# Patient Record
Sex: Male | Born: 1985 | Hispanic: Yes | Marital: Married | Smoking: Never smoker
Health system: Southern US, Community
[De-identification: ages and names within clinical notes are randomized; demographics above are authoritative.]

## PROBLEM LIST (undated history)

## (undated) DIAGNOSIS — R12 Heartburn: Secondary | ICD-10-CM

## (undated) DIAGNOSIS — I10 Essential (primary) hypertension: Secondary | ICD-10-CM

## (undated) DIAGNOSIS — M5412 Radiculopathy, cervical region: Secondary | ICD-10-CM

## (undated) HISTORY — PX: ADENOIDECTOMY: SUR15

---

## 2016-12-03 DIAGNOSIS — T675XXA Heat exhaustion, unspecified, initial encounter: Secondary | ICD-10-CM | POA: Insufficient documentation

## 2018-10-12 ENCOUNTER — Other Ambulatory Visit: Payer: Self-pay

## 2018-10-12 ENCOUNTER — Emergency Department: Payer: Self-pay

## 2018-10-12 ENCOUNTER — Emergency Department
Admission: EM | Admit: 2018-10-12 | Discharge: 2018-10-12 | Disposition: A | Discharge status: Home / Self Care | Payer: Self-pay | Attending: Emergency Medicine | Admitting: Emergency Medicine

## 2018-10-12 ENCOUNTER — Encounter: Payer: Self-pay | Admitting: *Deleted

## 2018-10-12 DIAGNOSIS — J069 Acute upper respiratory infection, unspecified: Secondary | ICD-10-CM | POA: Insufficient documentation

## 2018-10-12 LAB — COMPREHENSIVE METABOLIC PANEL
ALK PHOS: 45 U/L (ref 38–126)
ALT: 26 U/L (ref 0–44)
AST: 24 U/L (ref 15–41)
Albumin: 4.2 g/dL (ref 3.5–5.0)
Anion gap: 10 (ref 5–15)
BUN: 15 mg/dL (ref 6–20)
CO2: 28 mmol/L (ref 22–32)
CREATININE: 0.96 mg/dL (ref 0.61–1.24)
Calcium: 9 mg/dL (ref 8.9–10.3)
Chloride: 99 mmol/L (ref 98–111)
GFR calc Af Amer: >60 mL/min (ref >60)
GFR calc non Af Amer: >60 mL/min (ref >60)
Glucose, Bld: 103 mg/dL — ABNORMAL HIGH (ref 70–99)
Potassium: 3.8 mmol/L (ref 3.5–5.1)
SODIUM: 137 mmol/L (ref 135–145)
Total Bilirubin: 0.5 mg/dL (ref 0.3–1.2)
Total Protein: 7.3 g/dL (ref 6.5–8.1)

## 2018-10-12 LAB — CBC WITH DIFFERENTIAL/PLATELET
Abs Immature Granulocytes: 0.02 10*3/uL (ref 0.00–0.07)
BASOS PCT: 1 %
Basophils Absolute: 0.1 10*3/uL (ref 0.0–0.1)
Eosinophils Absolute: 0.5 10*3/uL (ref 0.0–0.5)
Eosinophils Relative: 6 %
HCT: 43.6 % (ref 39.0–52.0)
Hemoglobin: 14.9 g/dL (ref 13.0–17.0)
Immature Granulocytes: 0 %
Lymphocytes Relative: 38 %
Lymphs Abs: 2.9 10*3/uL (ref 0.7–4.0)
MCH: 29 pg (ref 26.0–34.0)
MCHC: 34.2 g/dL (ref 30.0–36.0)
MCV: 85 fL (ref 80.0–100.0)
Monocytes Absolute: 0.5 10*3/uL (ref 0.1–1.0)
Monocytes Relative: 6 %
Neutro Abs: 3.7 10*3/uL (ref 1.7–7.7)
Neutrophils Relative %: 49 %
Platelets: 285 10*3/uL (ref 150–400)
RBC: 5.13 MIL/uL (ref 4.22–5.81)
RDW: 12.6 % (ref 11.5–15.5)
WBC: 7.6 10*3/uL (ref 4.0–10.5)
nRBC: 0 % (ref 0.0–0.2)

## 2018-10-12 LAB — INFLUENZA PANEL BY PCR (TYPE A & B)
Influenza A By PCR: NEGATIVE
Influenza B By PCR: NEGATIVE

## 2018-10-12 MED ORDER — ALBUTEROL SULFATE HFA 108 (90 BASE) MCG/ACT IN AERS: 2.0000 | INHALATION_SPRAY | Freq: Four times a day (QID) | RESPIRATORY_TRACT | 2 refills | Status: DC | PRN

## 2018-10-12 NOTE — ED Triage Notes (Signed)
Pt arrives with c/o shortness of breath for about 3 days. Reports he has been cleaning a lot with bleach over the past several days because a [place] at the base was there over the weekend and is COVID r/o. He has also felt some lightheadedness, dry cough and "felt hot" but has not taken his temperature. No meds PTA. Pt works at [employer] in [place] and returned here yesterday.

## 2018-10-12 NOTE — Discharge Instructions (Addendum)
Please check your temperature multiple times per day.  Please keep yourself and self-isolation.  If you spike a fever please call your primary care doctor to try to arrange for outpatient testing.  Return to the emergency department for any significant trouble breathing or chest pain, or any other symptom personally concerning to yourself.  Otherwise please use Tylenol 1000 mg every 6 hours at home for fever/discomfort.

## 2018-10-12 NOTE — ED Provider Notes (Signed)
[facility] Emergency Department Provider Note  Time seen: 8:45 PM  I have reviewed the triage vital signs and the nursing notes.   HISTORY  Chief Complaint Shortness of Breath    HPI Damon Stevens is a 33 y.o. male with no significant past medical history presents to the emergency department for 3 days of cough congestion and fever.  According to the patient he is in the [employer], has been stationed in [place] area, and came to this area to visit friends this morning.  Patient states subjective fever, cough, congestion, shortness of breath and chest tightness.  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 History reviewed. No pertinent past medical history.  There are no active problems to display for this patient.   History reviewed. No pertinent surgical history.  Prior to Admission medications   Not on File    No Known Allergies  No family history on file.  Social History Social History   Tobacco Use  . Smoking status: Never Smoker  Substance Use Topics  . Alcohol use: Yes  . Drug use: Never    Review of Systems Constitutional: Negative for fever. Cardiovascular: Negative for chest pain. Respiratory: Shortness of breath.  Positive for cough. Gastrointestinal: Negative for abdominal pain, vomiting Musculoskeletal: Negative for musculoskeletal complaints Skin: Negative for skin complaints  Neurological: Negative for headache All other ROS negative  ____________________________________________   PHYSICAL EXAM:  VITAL SIGNS: ED Triage Vitals [10/12/18 1928]  Enc Vitals Group     BP 135/82     Pulse Rate 82     Resp 18     Temp 98.5 F (36.9 C)     Temp Source Oral     SpO2 98 %     Weight      Height      Head Circumference      Peak Flow      Pain Score      Pain Loc      Pain Edu?      Excl. in  GC?    Constitutional: Alert and oriented. Well appearing and in no distress. Eyes: Normal exam ENT   Head: Normocephalic and atraumatic.   Mouth/Throat: Mucous membranes are moist. Cardiovascular: Normal rate, regular rhythm.  Respiratory: Normal respiratory effort without tachypnea nor retractions. Breath sounds are clear Gastrointestinal: Soft and nontender. No distention.   Musculoskeletal: Nontender with normal range of motion in all extremities. Neurologic:  Normal speech and language. No gross focal neurologic deficits  Skin:  Skin is warm, dry and intact.  Psychiatric: Mood and affect are normal.   ____________________________________________    RADIOLOGY  Chest x-ray negative  ____________________________________________   INITIAL IMPRESSION / ASSESSMENT AND PLAN / ED COURSE  Pertinent labs & imaging results that were available during my care of the patient were reviewed by me and considered in my medical decision making (see chart for details).  Patient presents to the emergency department with concerns of cough and congestion, subjective fever at home.  Patient is visiting from [place] area is in the [employer].  Reassuringly patient is afebrile in the emergency department.  Patient does have occasional dry cough.  We will check labs including influenza.  Patient's influenza test is negative, labs are largely within normal limits including a negative/normal white blood cell count.  No lymphopenia.  No LFT elevation.  Extremely low suspicion  for coronavirus I discussed this with the patient, we will defer testing at this time.  I did discuss my normal self-isolation precautions as well as supportive care at home.  ____________________________________________   FINAL CLINICAL IMPRESSION(S) / ED DIAGNOSES  Upper respiratory infection   Minna Antis, MD 10/12/18 2204

## 2019-04-20 ENCOUNTER — Emergency Department: Payer: Self-pay

## 2019-04-20 ENCOUNTER — Other Ambulatory Visit: Payer: Self-pay

## 2019-04-20 ENCOUNTER — Emergency Department
Admission: EM | Admit: 2019-04-20 | Discharge: 2019-04-20 | Disposition: A | Discharge status: Home / Self Care | Payer: Self-pay | Attending: Emergency Medicine | Admitting: Emergency Medicine

## 2019-04-20 DIAGNOSIS — R0789 Other chest pain: Secondary | ICD-10-CM | POA: Insufficient documentation

## 2019-04-20 HISTORY — DX: Heartburn: R12

## 2019-04-20 LAB — CBC
HCT: 43 % (ref 39.0–52.0)
Hemoglobin: 14.6 g/dL (ref 13.0–17.0)
MCH: 28.6 pg (ref 26.0–34.0)
MCHC: 34 g/dL (ref 30.0–36.0)
MCV: 84.3 fL (ref 80.0–100.0)
Platelets: 263 10*3/uL (ref 150–400)
RBC: 5.1 MIL/uL (ref 4.22–5.81)
RDW: 13 % (ref 11.5–15.5)
WBC: 9.8 10*3/uL (ref 4.0–10.5)
nRBC: 0 % (ref 0.0–0.2)

## 2019-04-20 LAB — BASIC METABOLIC PANEL
Anion gap: 10 (ref 5–15)
BUN: 17 mg/dL (ref 6–20)
CO2: 28 mmol/L (ref 22–32)
Calcium: 9.7 mg/dL (ref 8.9–10.3)
Chloride: 102 mmol/L (ref 98–111)
Creatinine, Ser: 0.89 mg/dL (ref 0.61–1.24)
GFR calc Af Amer: >60 mL/min (ref >60)
GFR calc non Af Amer: >60 mL/min (ref >60)
Glucose, Bld: 102 mg/dL — ABNORMAL HIGH (ref 70–99)
Potassium: 3.9 mmol/L (ref 3.5–5.1)
Sodium: 140 mmol/L (ref 135–145)

## 2019-04-20 LAB — TROPONIN I (HIGH SENSITIVITY): Troponin I (High Sensitivity): 4 ng/L (ref <18)

## 2019-04-20 MED ORDER — ASPIRIN 81 MG PO CHEW
324.0000 mg | CHEWABLE_TABLET | Freq: Once | ORAL | Status: AC
  Administered 2019-04-20: 324 mg via ORAL
  Filled 2019-04-20: qty 4

## 2019-04-20 NOTE — ED Provider Notes (Signed)
[facility] Emergency Department Provider Note  ____________________________________________   First MD Initiated Contact with Patient 04/20/19 [phone]     (approximate)  I have reviewed the triage vital signs and the nursing notes.   HISTORY  Chief Complaint Chest Pain    HPI Damon Stevens is a 33 y.o. male with medical history as listed below who presents for evaluation of chest pain for the last 3 days.  He said that it started after he spent the day working on his car which required a lot of lifting and straining.  He has sharp pain in the left side of his chest that is been constant for the last 3 days and worse when he moves around or presses on it, nothing in particular makes it better.  He started to worry that he was dying from heart attack and he said that he thinks he had a panic attack which made the chest pain worse.  He denies any shortness of breath, nausea, vomiting, fever, chills, sore throat, abdominal pain.  He has no history of heart disease and no first-degree relatives who had heart heart attacks at age 44 or less.  He has no history of diabetes, smoking, high cholesterol, obesity, nor peripheral vascular disease nor prior stroke.   He reports the pain is currently mild.       Past Medical History:  Diagnosis Date  . Heartburn     There are no active problems to display for this patient.   History reviewed. No pertinent surgical history.  Prior to Admission medications   Medication Sig Start Date End Date Taking? Authorizing Provider  albuterol (PROVENTIL HFA;VENTOLIN HFA) 108 (90 Base) MCG/ACT inhaler Inhale 2 puffs into the lungs every 6 (six) hours as needed for wheezing or shortness of breath. 10/12/18   Minna Antis, MD    Allergies Patient has no known allergies.  No family history on file.  Social History Social History   Tobacco Use  . Smoking status: Never Smoker  . Smokeless tobacco: Never Used   Substance Use Topics  . Alcohol use: Yes  . Drug use: Never    Review of Systems Constitutional: No fever/chills Eyes: No visual changes. ENT: No sore throat. Cardiovascular: +chest pain. Respiratory: Denies shortness of breath. Gastrointestinal: No abdominal pain.  No nausea, no vomiting.  No diarrhea.  No constipation. Genitourinary: Negative for dysuria. Musculoskeletal: Negative for neck pain.  Negative for back pain. Integumentary: Negative for rash. Neurological: Negative for headaches, focal weakness or numbness.   ____________________________________________   PHYSICAL EXAM:  VITAL SIGNS: ED Triage Vitals  Enc Vitals Group     BP 04/20/19 0212 130/82     Pulse Rate 04/20/19 0212 68     Resp 04/20/19 0212 18     Temp 04/20/19 0212 (!) 97.4 F (36.3 C)     Temp Source 04/20/19 0212 Oral     SpO2 04/20/19 0308 99 %     Weight 04/20/19 0212 81.6 kg (180 lb)     Height 04/20/19 0212 1.829 m (6')     Head Circumference --      Peak Flow --      Pain Score 04/20/19 0212 7     Pain Loc --      Pain Edu? --      Excl. in GC? --     Constitutional: Alert and oriented.  Well-appearing and in no distress. Eyes: Conjunctivae are normal.  Head: Atraumatic. Nose: No congestion/rhinnorhea. Mouth/Throat:  Mucous membranes are moist. Neck: No stridor.  No meningeal signs.   Cardiovascular: Normal rate, regular rhythm. Good peripheral circulation. Grossly normal heart sounds. Respiratory: Normal respiratory effort.  No retractions. Gastrointestinal: Soft and nontender. No distention.  Musculoskeletal: Reproducible anterior chest wall tenderness to both the right and left side of the chest although the tenderness to palpation of the left side is greater than the right.  No lower extremity tenderness nor edema. No gross deformities of extremities. Neurologic:  Normal speech and language. No gross focal neurologic deficits are appreciated.  Skin:  Skin is warm, dry and intact.  Psychiatric: Mood and affect are normal. Speech and behavior are normal.  ____________________________________________   LABS (all labs ordered are listed, but only abnormal results are displayed)  Labs Reviewed  BASIC METABOLIC PANEL - Abnormal; Notable for the following components:      Result Value   Glucose, Bld 102 (*)    All other components within normal limits  CBC  TROPONIN I (HIGH SENSITIVITY)   ____________________________________________  EKG  ED ECG REPORT I, Hinda Kehr, the attending physician, personally viewed and interpreted this ECG.  Date: 04/20/2019 EKG Time: 2:17 AM Rate: 63 Rhythm: normal sinus rhythm QRS Axis: normal Intervals: normal ST/T Wave abnormalities: normal Narrative Interpretation: no evidence of acute ischemia  ____________________________________________  RADIOLOGY I, Hinda Kehr, personally viewed and evaluated these images (plain radiographs) as part of my medical decision making, as well as reviewing the written report by the radiologist.  ED MD interpretation: No indication of acute abnormality on chest x-ray  Official radiology report(s): Dg Chest 2 View  Result Date: 04/20/2019 CLINICAL DATA:  Midsternal chest pain for 3 days, question stress or panic attack EXAM: CHEST - 2 VIEW COMPARISON:  Radiograph 10/12/2018 FINDINGS: No consolidation, features of edema, pneumothorax, or effusion. Pulmonary vascularity is normally distributed. The cardiomediastinal contours are unremarkable. No acute osseous or soft tissue abnormality. IMPRESSION: No acute cardiopulmonary abnormality. Electronically Signed   By: Lovena Le M.D.   On: 04/20/2019 03:12    ____________________________________________   PROCEDURES   Procedure(s) performed (including Critical Care):  Procedures   ____________________________________________   INITIAL IMPRESSION / MDM / [place] / ED COURSE  As part of my medical decision making, I  reviewed the following data within the [place] notes reviewed and incorporated, Labs reviewed , EKG interpreted , Old chart reviewed, Radiograph reviewed  and Notes from prior ED visits   Differential diagnosis includes, but is not limited to, anterior chest wall strain, costochondritis, pericarditis/myocarditis, ACS, PE, pneumonia, pneumothorax.  The patient is well-appearing and in no distress, healthy body habitus, normal vital signs, no tachycardia nor hypoxemia.  Lab work is reassuring with a normal basic metabolic panel, CBC, and a high-sensitivity troponin of 4.  He has a  HEAR Score: 0 and is PERC negative.  I do not feel it is necessary to get a second troponin at this time based on his history, physical exam, low risk, and reproducible tenderness to palpation of the anterior chest wall.  I provided reassurance and encouraged outpatient follow-up.  I am giving him a full dose aspirin before he goes but that is primarily and its role as an NSAID and analgesic rather than as an antiplatelet agent.  I gave my usual customary return precautions and he understands and agrees with the plan.       ____________________________________________  FINAL CLINICAL IMPRESSION(S) / ED DIAGNOSES  Final diagnoses:  Chest wall  pain  Atypical chest pain     MEDICATIONS GIVEN DURING THIS VISIT:  Medications  aspirin chewable tablet 324 mg (324 mg Oral Given 04/20/19 0416)     ED Discharge Orders    None      *Please note:  Damon Stevens was evaluated in Emergency Department on 04/20/2019 for the symptoms described in the history of present illness. He was evaluated in the context of the global COVID-19 pandemic, which necessitated consideration that the patient might be at risk for infection with the SARS-CoV-2 virus that causes COVID-19. Institutional protocols and algorithms that pertain to the evaluation of patients at risk for COVID-19 are in a state of rapid  change based on information released by regulatory bodies including the CDC and federal and state organizations. These policies and algorithms were followed during the patient's care in the ED.  Some ED evaluations and interventions may be delayed as a result of limited staffing during the pandemic.*  Note:  This document was prepared using Dragon voice recognition software and may include unintentional dictation errors.   Loleta RoseForbach, Faheem Ziemann, MD 04/20/19 [phone]

## 2019-04-20 NOTE — ED Triage Notes (Signed)
Patient to ED for complaint of midsternal chest pain x 3 days. Tonight he isn't sure if it was stress or a panic attack that made it worse. Patient denies nausea or vomiting, denies fever or cough.

## 2019-04-20 NOTE — ED Notes (Signed)
Pt reports chest pain sharp 6/10 left chest worse with deep breathing, pt reports holding his son earlier today and pt reports probable muscle pull and then reports panic attack with "throat felt like it was closing" - pt concerned because of family member dying of cardiac illness

## 2019-04-20 NOTE — ED Notes (Signed)
No peripheral IV placed this visit.   Discharge instructions reviewed with patient. Questions fielded by this RN. Patient verbalizes understanding of instructions. Patient discharged home in stable condition per XXX . No acute distress noted at time of discharge.

## 2019-04-20 NOTE — Discharge Instructions (Signed)

## 2019-05-14 ENCOUNTER — Other Ambulatory Visit: Payer: Self-pay

## 2019-05-14 ENCOUNTER — Telehealth: Payer: Self-pay | Admitting: Emergency Medicine

## 2019-05-14 ENCOUNTER — Emergency Department
Admission: EM | Admit: 2019-05-14 | Discharge: 2019-05-14 | Disposition: A | Discharge status: Home / Self Care | Payer: Self-pay | Attending: Emergency Medicine | Admitting: Emergency Medicine

## 2019-05-14 DIAGNOSIS — R0789 Other chest pain: Secondary | ICD-10-CM | POA: Insufficient documentation

## 2019-05-14 DIAGNOSIS — Z5321 Procedure and treatment not carried out due to patient leaving prior to being seen by health care provider: Secondary | ICD-10-CM | POA: Insufficient documentation

## 2019-05-14 LAB — COMPREHENSIVE METABOLIC PANEL
ALT: 23 U/L (ref 0–44)
AST: 25 U/L (ref 15–41)
Albumin: 4.5 g/dL (ref 3.5–5.0)
Alkaline Phosphatase: 53 U/L (ref 38–126)
Anion gap: 8 (ref 5–15)
BUN: 11 mg/dL (ref 6–20)
CO2: 29 mmol/L (ref 22–32)
Calcium: 9.1 mg/dL (ref 8.9–10.3)
Chloride: 101 mmol/L (ref 98–111)
Creatinine, Ser: 1.01 mg/dL (ref 0.61–1.24)
GFR calc Af Amer: >60 mL/min (ref >60)
GFR calc non Af Amer: >60 mL/min (ref >60)
Glucose, Bld: 103 mg/dL — ABNORMAL HIGH (ref 70–99)
Potassium: 3.6 mmol/L (ref 3.5–5.1)
Sodium: 138 mmol/L (ref 135–145)
Total Bilirubin: 0.7 mg/dL (ref 0.3–1.2)
Total Protein: 8 g/dL (ref 6.5–8.1)

## 2019-05-14 LAB — CBC
HCT: 42.6 % (ref 39.0–52.0)
Hemoglobin: 14.5 g/dL (ref 13.0–17.0)
MCH: 28.9 pg (ref 26.0–34.0)
MCHC: 34 g/dL (ref 30.0–36.0)
MCV: 85 fL (ref 80.0–100.0)
Platelets: 295 10*3/uL (ref 150–400)
RBC: 5.01 MIL/uL (ref 4.22–5.81)
RDW: 13 % (ref 11.5–15.5)
WBC: 7.7 10*3/uL (ref 4.0–10.5)
nRBC: 0 % (ref 0.0–0.2)

## 2019-05-14 LAB — TROPONIN I (HIGH SENSITIVITY): Troponin I (High Sensitivity): <2 ng/L (ref <18)

## 2019-05-14 MED ORDER — IBUPROFEN 600 MG PO TABS
600.0000 mg | ORAL_TABLET | Freq: Once | ORAL | Status: AC
  Administered 2019-05-14: 600 mg via ORAL
  Filled 2019-05-14: qty 1

## 2019-05-14 NOTE — ED Triage Notes (Signed)
Pt here with co chest pain for 1-2 days was here a month ago for the same and was told it was "muscle was swollen".

## 2019-05-14 NOTE — Telephone Encounter (Signed)
Called patient due to lwot to inquire about condition and follow up plans. He says he strained a muscle.  Has had a couple previous visits.  He does not have a pcp, and I explained that he should get a pcp to find out why this happens.

## 2019-12-12 DIAGNOSIS — R079 Chest pain, unspecified: Secondary | ICD-10-CM | POA: Insufficient documentation

## 2019-12-12 DIAGNOSIS — R4589 Other symptoms and signs involving emotional state: Secondary | ICD-10-CM | POA: Insufficient documentation

## 2019-12-12 DIAGNOSIS — F419 Anxiety disorder, unspecified: Secondary | ICD-10-CM | POA: Insufficient documentation

## 2019-12-12 DIAGNOSIS — I10 Essential (primary) hypertension: Secondary | ICD-10-CM | POA: Insufficient documentation

## 2019-12-25 ENCOUNTER — Encounter: Payer: Self-pay | Admitting: *Deleted

## 2019-12-25 ENCOUNTER — Other Ambulatory Visit: Payer: Self-pay

## 2019-12-25 ENCOUNTER — Emergency Department: Payer: Self-pay

## 2019-12-25 ENCOUNTER — Emergency Department
Admission: EM | Admit: 2019-12-25 | Discharge: 2019-12-25 | Disposition: A | Discharge status: Home / Self Care | Payer: Self-pay | Attending: Emergency Medicine | Admitting: Emergency Medicine

## 2019-12-25 DIAGNOSIS — R197 Diarrhea, unspecified: Secondary | ICD-10-CM | POA: Insufficient documentation

## 2019-12-25 DIAGNOSIS — R112 Nausea with vomiting, unspecified: Secondary | ICD-10-CM | POA: Insufficient documentation

## 2019-12-25 DIAGNOSIS — Z5321 Procedure and treatment not carried out due to patient leaving prior to being seen by health care provider: Secondary | ICD-10-CM | POA: Insufficient documentation

## 2019-12-25 DIAGNOSIS — R0789 Other chest pain: Secondary | ICD-10-CM | POA: Insufficient documentation

## 2019-12-25 LAB — CBC
HCT: 39.6 % (ref 39.0–52.0)
Hemoglobin: 13.9 g/dL (ref 13.0–17.0)
MCH: 29.2 pg (ref 26.0–34.0)
MCHC: 35.1 g/dL (ref 30.0–36.0)
MCV: 83.2 fL (ref 80.0–100.0)
Platelets: 280 10*3/uL (ref 150–400)
RBC: 4.76 MIL/uL (ref 4.22–5.81)
RDW: 12 % (ref 11.5–15.5)
WBC: 7 10*3/uL (ref 4.0–10.5)
nRBC: 0 % (ref 0.0–0.2)

## 2019-12-25 LAB — BASIC METABOLIC PANEL
Anion gap: 11 (ref 5–15)
BUN: 14 mg/dL (ref 6–20)
CO2: 27 mmol/L (ref 22–32)
Calcium: 9 mg/dL (ref 8.9–10.3)
Chloride: 102 mmol/L (ref 98–111)
Creatinine, Ser: 1.05 mg/dL (ref 0.61–1.24)
GFR calc Af Amer: >60 mL/min (ref >60)
GFR calc non Af Amer: >60 mL/min (ref >60)
Glucose, Bld: 105 mg/dL — ABNORMAL HIGH (ref 70–99)
Potassium: 3.6 mmol/L (ref 3.5–5.1)
Sodium: 140 mmol/L (ref 135–145)

## 2019-12-25 LAB — TROPONIN I (HIGH SENSITIVITY)
Troponin I (High Sensitivity): 2 ng/L (ref <18)
Troponin I (High Sensitivity): <2 ng/L (ref <18)

## 2019-12-25 MED ORDER — SODIUM CHLORIDE 0.9% FLUSH: 3.0000 mL | Freq: Once | INTRAVENOUS | Status: DC

## 2019-12-25 NOTE — ED Triage Notes (Signed)
Pt has had blood pressure meds changed 4 times during the past month. No bp meds for 2 days.  Pt reports n/v/d today.  Pt reports intermittent chest pain today.  Pt alert.  Speech clear.

## 2019-12-29 ENCOUNTER — Other Ambulatory Visit: Payer: Self-pay

## 2019-12-29 ENCOUNTER — Emergency Department: Payer: No Typology Code available for payment source

## 2019-12-29 ENCOUNTER — Emergency Department
Admission: EM | Admit: 2019-12-29 | Discharge: 2019-12-29 | Disposition: A | Discharge status: Home / Self Care | Payer: No Typology Code available for payment source | Attending: Emergency Medicine | Admitting: Emergency Medicine

## 2019-12-29 ENCOUNTER — Encounter: Payer: Self-pay | Admitting: Emergency Medicine

## 2019-12-29 DIAGNOSIS — R0602 Shortness of breath: Secondary | ICD-10-CM | POA: Insufficient documentation

## 2019-12-29 DIAGNOSIS — R0789 Other chest pain: Secondary | ICD-10-CM

## 2019-12-29 DIAGNOSIS — I10 Essential (primary) hypertension: Secondary | ICD-10-CM | POA: Insufficient documentation

## 2019-12-29 HISTORY — DX: Essential (primary) hypertension: I10

## 2019-12-29 LAB — CBC WITH DIFFERENTIAL/PLATELET
Abs Immature Granulocytes: 0.03 10*3/uL (ref 0.00–0.07)
Basophils Absolute: 0 10*3/uL (ref 0.0–0.1)
Basophils Relative: 0 %
Eosinophils Absolute: 0.3 10*3/uL (ref 0.0–0.5)
Eosinophils Relative: 3 %
HCT: 39.7 % (ref 39.0–52.0)
Hemoglobin: 13.8 g/dL (ref 13.0–17.0)
Immature Granulocytes: 0 %
Lymphocytes Relative: 33 %
Lymphs Abs: 3.4 10*3/uL (ref 0.7–4.0)
MCH: 28.9 pg (ref 26.0–34.0)
MCHC: 34.8 g/dL (ref 30.0–36.0)
MCV: 83.1 fL (ref 80.0–100.0)
Monocytes Absolute: 0.5 10*3/uL (ref 0.1–1.0)
Monocytes Relative: 5 %
Neutro Abs: 6 10*3/uL (ref 1.7–7.7)
Neutrophils Relative %: 59 %
Platelets: 290 10*3/uL (ref 150–400)
RBC: 4.78 MIL/uL (ref 4.22–5.81)
RDW: 12.4 % (ref 11.5–15.5)
WBC: 10.2 10*3/uL (ref 4.0–10.5)
nRBC: 0 % (ref 0.0–0.2)

## 2019-12-29 LAB — BASIC METABOLIC PANEL
Anion gap: 9 (ref 5–15)
BUN: 18 mg/dL (ref 6–20)
CO2: 28 mmol/L (ref 22–32)
Calcium: 9.3 mg/dL (ref 8.9–10.3)
Chloride: 101 mmol/L (ref 98–111)
Creatinine, Ser: 1.02 mg/dL (ref 0.61–1.24)
GFR calc Af Amer: >60 mL/min (ref >60)
GFR calc non Af Amer: >60 mL/min (ref >60)
Glucose, Bld: 94 mg/dL (ref 70–99)
Potassium: 3.8 mmol/L (ref 3.5–5.1)
Sodium: 138 mmol/L (ref 135–145)

## 2019-12-29 LAB — TROPONIN I (HIGH SENSITIVITY): Troponin I (High Sensitivity): 3 ng/L (ref <18)

## 2019-12-29 NOTE — ED Triage Notes (Signed)
Pt arrives ambulatory to triage with c/o SOB and possible Hypertension. Pt is able to talk in complete sentences at this time with NAD.

## 2019-12-29 NOTE — ED Provider Notes (Signed)
[facility] Emergency Department Provider Note ____________________________________________   First MD Initiated Contact with Patient 12/29/19 [phone]     (approximate)  I have reviewed the triage vital signs and the nursing notes.   HISTORY  Chief Complaint Shortness of Breath    HPI Damon Stevens is a 34 y.o. male with PMH as noted below who presents with an episode of shortness of breath, acute onset around 5 AM, which awoke him from sleep.  The patient states it is now resolved.  He reports that he felt congested in his nose.  The patient has also had chest pain over the last few days which he describes as sharp and exacerbated by certain positions and by touching his chest.  He thinks it is a rib pain.  He reports one episode of vomiting this morning.  The patient states that he was diagnosed with hypertension a few months ago and was put on medication but it was changed several times due to side effects.  He was most recently on amlodipine, but it was stopped 5 days ago because he stated that he felt lightheaded and his doctor decided to try to see if his blood pressure could be controlled without medication.  Past Medical History:  Diagnosis Date  . Heartburn   . Hypertension     There are no problems to display for this patient.   History reviewed. No pertinent surgical history.  Prior to Admission medications   Medication Sig Start Date End Date Taking? Authorizing Provider  albuterol (PROVENTIL HFA;VENTOLIN HFA) 108 (90 Base) MCG/ACT inhaler Inhale 2 puffs into the lungs every 6 (six) hours as needed for wheezing or shortness of breath. 10/12/18   Minna Antis, MD    Allergies Patient has no known allergies.  No family history on file.  Social History Social History   Tobacco Use  . Smoking status: Never Smoker  . Smokeless tobacco: Never Used  Substance Use Topics  . Alcohol use: Yes  . Drug use: Never    Review of  Systems  Constitutional: No fever. Eyes: No redness. ENT: No sore throat.  Positive for resolved nasal congestion. Cardiovascular: Positive for chest pain. Respiratory: Positive for resolved shortness of breath. Gastrointestinal: Positive for resolved vomiting.  No diarrhea.  Genitourinary: Negative for flank pain. Musculoskeletal: Negative for back pain. Skin: Negative for rash. Neurological: Negative for headache.   ____________________________________________   PHYSICAL EXAM:  VITAL SIGNS: ED Triage Vitals  Enc Vitals Group     BP 12/29/19 0517 140/85     Pulse Rate 12/29/19 0517 95     Resp 12/29/19 0517 15     Temp 12/29/19 0517 98 F (36.7 C)     Temp Source 12/29/19 0517 Oral     SpO2 12/29/19 0517 100 %     Weight 12/29/19 0517 183 lb (83 kg)     Height 12/29/19 0517 6' (1.829 m)     Head Circumference --      Peak Flow --      Pain Score 12/29/19 0514 5     Pain Loc --      Pain Edu? --      Excl. in GC? --     Constitutional: Alert and oriented. Well appearing and in no acute distress. Eyes: Conjunctivae are normal.  Head: Atraumatic. Nose: No congestion/rhinnorhea.   Mouth/Throat: Mucous membranes are moist.   Neck: Normal range of motion.  Cardiovascular: Normal rate, regular rhythm. Grossly normal heart sounds.  Good peripheral circulation.  Reproducible anterior chest wall pain. Respiratory: Normal respiratory effort.  No retractions. Lungs CTAB. Gastrointestinal: No distention.  Musculoskeletal:  Extremities warm and well perfused.  Neurologic:  Normal speech and language. No gross focal neurologic deficits are appreciated.  Skin:  Skin is warm and dry. No rash noted. Psychiatric: Slightly anxious appearing.  Mood and affect are normal. Speech and behavior are normal.  ____________________________________________   LABS (all labs ordered are listed, but only abnormal results are displayed)  Labs Reviewed  CBC WITH DIFFERENTIAL/PLATELET  BASIC  METABOLIC PANEL  TROPONIN I (HIGH SENSITIVITY)   ____________________________________________  EKG  ED ECG REPORT I, Arta Silence, the attending physician, personally viewed and interpreted this ECG.  Date: 12/29/2019 EKG Time: 0513 Rate: 89 Rhythm: normal sinus rhythm QRS Axis: normal Intervals: normal ST/T Wave abnormalities: normal Narrative Interpretation: no evidence of acute ischemia  ____________________________________________  RADIOLOGY  CXR: No focal infiltrate or other acute abnormality  ____________________________________________   PROCEDURES  Procedure(s) performed: No  Procedures  Critical Care performed: No ____________________________________________   INITIAL IMPRESSION / ASSESSMENT AND PLAN / ED COURSE  Pertinent labs & imaging results that were available during my care of the patient were reviewed by me and considered in my medical decision making (see chart for details).  34 year old male with PMH as noted above including hypertension presents with an episode of shortness of breath that awoke him from sleep, and which he felt was associated with nasal congestion.  This has now resolved.  The patient has had some atypical and reproducible chest pain over the last few days.  He states that he had been on amlodipine but stopped several days ago per his doctor at the [place].  On exam today, he is somewhat anxious but well-appearing.  His vital signs are normal.  The physical exam is unremarkable except for mild reproducible chest wall tenderness which corresponds to the pain he is experiencing.  His lungs are clear.  Oropharynx is clear.  Work-up was obtained from triage including basic labs, troponin, EKG, and chest x-ray.  All are within normal limits.  Given the duration of the chest pain and the atypical nature, there is no indication for a repeat troponin.  The patient is extremely low risk for ACS.  There is no evidence of PE or other acute  cause of the shortness of breath and chest pain, especially given the normal vital signs and the fact that the shortness of breath has resolved.  At this time, the patient is stable for discharge home.  I counseled him on the results of the work-up and reassured him.  There is no evidence of acute respiratory issue.  He states he is following up with his primary care doctor next week, and I advised him to monitor his blood pressure at home and check in with the PMD.  I gave him thorough return precautions and he expressed understanding.  ____________________________________________   FINAL CLINICAL IMPRESSION(S) / ED DIAGNOSES  Final diagnoses:  SOB (shortness of breath)  Atypical chest pain      NEW MEDICATIONS STARTED DURING THIS VISIT:  New Prescriptions   No medications on file     Note:  This document was prepared using Dragon voice recognition software and may include unintentional dictation errors.   Arta Silence, MD 12/29/19 [phone]

## 2019-12-29 NOTE — ED Notes (Signed)
Patient reports he hasn't taken his blood pressure medication in 5 days because he was told not to take it if BP was below 140/90.  Patient reports during the night head felt full and he couldn't breath through his nose well and was concerned about his health.  Patient is able to speak complete sentence without difficulty or distress.

## 2019-12-29 NOTE — Discharge Instructions (Addendum)
Return to the ER for new, worsening, or persistent severe shortness of breath, chest pain, weakness or lightheadedness, or any other new or worsening symptoms that concern you.  Follow-up with your primary care doctor for further guidance on your blood pressure medication.

## 2019-12-30 ENCOUNTER — Emergency Department
Admission: EM | Admit: 2019-12-30 | Discharge: 2019-12-30 | Disposition: A | Discharge status: Home / Self Care | Payer: No Typology Code available for payment source | Attending: Emergency Medicine | Admitting: Emergency Medicine

## 2019-12-30 ENCOUNTER — Other Ambulatory Visit: Payer: Self-pay

## 2019-12-30 DIAGNOSIS — R070 Pain in throat: Secondary | ICD-10-CM | POA: Diagnosis not present

## 2019-12-30 DIAGNOSIS — R0989 Other specified symptoms and signs involving the circulatory and respiratory systems: Secondary | ICD-10-CM | POA: Diagnosis not present

## 2019-12-30 DIAGNOSIS — I10 Essential (primary) hypertension: Secondary | ICD-10-CM | POA: Diagnosis not present

## 2019-12-30 DIAGNOSIS — F418 Other specified anxiety disorders: Secondary | ICD-10-CM

## 2019-12-30 DIAGNOSIS — R0981 Nasal congestion: Secondary | ICD-10-CM

## 2019-12-30 DIAGNOSIS — F419 Anxiety disorder, unspecified: Secondary | ICD-10-CM | POA: Diagnosis not present

## 2019-12-30 LAB — TROPONIN I (HIGH SENSITIVITY): Troponin I (High Sensitivity): <2 ng/L (ref <18)

## 2019-12-30 LAB — BASIC METABOLIC PANEL
Anion gap: 9 (ref 5–15)
BUN: 23 mg/dL — ABNORMAL HIGH (ref 6–20)
CO2: 28 mmol/L (ref 22–32)
Calcium: 9.2 mg/dL (ref 8.9–10.3)
Chloride: 101 mmol/L (ref 98–111)
Creatinine, Ser: 1.04 mg/dL (ref 0.61–1.24)
GFR calc Af Amer: >60 mL/min (ref >60)
GFR calc non Af Amer: >60 mL/min (ref >60)
Glucose, Bld: 105 mg/dL — ABNORMAL HIGH (ref 70–99)
Potassium: 3.6 mmol/L (ref 3.5–5.1)
Sodium: 138 mmol/L (ref 135–145)

## 2019-12-30 LAB — CBC
HCT: 40.6 % (ref 39.0–52.0)
Hemoglobin: 13.9 g/dL (ref 13.0–17.0)
MCH: 29 pg (ref 26.0–34.0)
MCHC: 34.2 g/dL (ref 30.0–36.0)
MCV: 84.6 fL (ref 80.0–100.0)
Platelets: 301 10*3/uL (ref 150–400)
RBC: 4.8 MIL/uL (ref 4.22–5.81)
RDW: 12.3 % (ref 11.5–15.5)
WBC: 8.5 10*3/uL (ref 4.0–10.5)
nRBC: 0 % (ref 0.0–0.2)

## 2019-12-30 LAB — GROUP A STREP BY PCR: Group A Strep by PCR: NOT DETECTED

## 2019-12-30 NOTE — ED Provider Notes (Signed)
[facility] Emergency Department Provider Note   ____________________________________________   I have reviewed the triage vital signs and the nursing notes.   HISTORY  Chief Complaint Nasal congestion  History limited by: Not Limited   HPI Damon Stevens is a 34 y.o. male who presents to the emergency department today with primary concern for nasal congestion. He states that his nose has been congested for the past couple of days. He finds that it makes breathing at night hard. He also has some complaint of throat swelling and burning after eating a shrimp and vomiting it up. The patient says it has happened once before. The patient says he took a sinus medication earlier.  Patient states he currently does not have a primary care although he is working on getting 1 through the [place].  Records reviewed. Per medical record review patient has a history of HTN, frequent ER visits, 12 in the past month per our EMR.   Past Medical History:  Diagnosis Date  . Heartburn   . Hypertension     There are no problems to display for this patient.   History reviewed. No pertinent surgical history.  Prior to Admission medications   Medication Sig Start Date End Date Taking? Authorizing Provider  albuterol (PROVENTIL HFA;VENTOLIN HFA) 108 (90 Base) MCG/ACT inhaler Inhale 2 puffs into the lungs every 6 (six) hours as needed for wheezing or shortness of breath. 10/12/18   Harvest Dark, MD    Allergies Shrimp [shellfish allergy]  History reviewed. No pertinent family history.  Social History Social History   Tobacco Use  . Smoking status: Never Smoker  . Smokeless tobacco: Never Used  Substance Use Topics  . Alcohol use: Yes  . Drug use: Never    Review of Systems Constitutional: No fever/chills Eyes: No visual changes. ENT: Positive for throat swelling and burning. Positive for nasal congestion. Cardiovascular: Denies chest pain. Respiratory:  Denies shortness of breath. Gastrointestinal: No abdominal pain.  No nausea, no vomiting.  No diarrhea.   Genitourinary: Negative for dysuria. Musculoskeletal: Left shoulder and neck pain. Skin: Negative for rash. Neurological: Negative for headaches, focal weakness or numbness.  ____________________________________________   PHYSICAL EXAM:  VITAL SIGNS: ED Triage Vitals  Enc Vitals Group     BP 12/30/19 0231 (!) 121/105     Pulse Rate 12/30/19 0230 73     Resp 12/30/19 0230 18     Temp 12/30/19 0230 98 F (36.7 C)     Temp Source 12/30/19 0230 Oral     SpO2 12/30/19 0230 100 %     Weight 12/30/19 0232 183 lb (83 kg)     Height 12/30/19 0232 6' (1.829 m)     Head Circumference --      Peak Flow --      Pain Score 12/30/19 0230 7   Constitutional: Alert and oriented.  Eyes: Conjunctivae are normal.  ENT      Head: Normocephalic and atraumatic.      Nose: No congestion/rhinnorhea.      Mouth/Throat: Mucous membranes are moist. No intraoral swelling. No erythema.       Neck: No stridor. Hematological/Lymphatic/Immunilogical: No cervical lymphadenopathy. Cardiovascular: Normal rate, regular rhythm.  No murmurs, rubs, or gallops.  Respiratory: Normal respiratory effort without tachypnea nor retractions. Breath sounds are clear and equal bilaterally. No wheezes/rales/rhonchi. Gastrointestinal: Soft and non tender. No rebound. No guarding.  Genitourinary: Deferred Musculoskeletal: Normal range of motion in all extremities. No lower extremity edema. Neurologic:  Normal speech and language. No gross focal neurologic deficits are appreciated.  Skin:  Skin is warm, dry and intact. No rash noted. Psychiatric: Mood and affect are normal. Speech and behavior are normal. Patient exhibits appropriate insight and judgment.  ____________________________________________    LABS (pertinent positives/negatives)  Trop hs <2 CBC wbc 8.5, hgb 13.9, plt 301 BMP wnl except glu 105, bun  23  ____________________________________________   EKG  I, Nance Pear, attending physician, personally viewed and interpreted this EKG  EKG Time: 0227 Rate: 81 Rhythm: sinus rhythm Axis: normal Intervals: qtc 441 QRS: narrow ST changes: no st elevation Impression: normal ekg  ____________________________________________    RADIOLOGY  None  ____________________________________________   PROCEDURES  Procedures  ____________________________________________   INITIAL IMPRESSION / ASSESSMENT AND PLAN / ED COURSE  Pertinent labs & imaging results that were available during my care of the patient were reviewed by me and considered in my medical decision making (see chart for details).   Patient presented to the emergency department today because of concerns for nasal congestion.  Patient also had concerns for throat swelling and burning.  Patient additionally had other complaints that he said he has been dealing with including high blood pressure, cervical radiculopathy.  Per chart review the patient has multiple emergency department visits recently and does appear to have frequent emergency department visits for quite some time.  Physical exam without concerning findings today.  Blood work without concerning findings.  I did have a conversation about the patient about frequent emergency department visits.  I do have some concerns that this could eventually lead to harm either through hospital-acquired infections or radiation exposure.  I strongly encourage the patient to establish care with a primary care provider so he could have a constant source that could help him deal with his myriad of complaints.  ____________________________________________   FINAL CLINICAL IMPRESSION(S) / ED DIAGNOSES  Final diagnoses:  Nasal congestion  Anxiety about health     Note: This dictation was prepared with Dragon dictation. Any transcriptional errors that result from this process  are unintentional     Nance Pear, MD 12/30/19 [phone]

## 2019-12-30 NOTE — ED Triage Notes (Addendum)
Pt arrives to traige with c/o SOB, and dizziness, pt states "I can't get enough breath". Pt states he had eaten shrimp "about 2 hours ago". Pt c/o tongue swelling, NAD noted, Pt able to talk in complete sentences. Pt states previous similar reaction after eating shrimp (date of previous encounter unknown).

## 2019-12-30 NOTE — Discharge Instructions (Signed)
Please seek medical attention for any high fevers, chest pain, shortness of breath, change in behavior, persistent vomiting, bloody stool or any other new or concerning symptoms.  

## 2020-01-07 ENCOUNTER — Other Ambulatory Visit: Payer: Self-pay

## 2020-01-07 ENCOUNTER — Encounter: Payer: Self-pay | Admitting: Emergency Medicine

## 2020-01-07 ENCOUNTER — Emergency Department: Payer: No Typology Code available for payment source

## 2020-01-07 DIAGNOSIS — R079 Chest pain, unspecified: Secondary | ICD-10-CM | POA: Diagnosis not present

## 2020-01-07 DIAGNOSIS — Z5321 Procedure and treatment not carried out due to patient leaving prior to being seen by health care provider: Secondary | ICD-10-CM | POA: Diagnosis not present

## 2020-01-07 LAB — CBC
HCT: 40.6 % (ref 39.0–52.0)
Hemoglobin: 14.5 g/dL (ref 13.0–17.0)
MCH: 29.2 pg (ref 26.0–34.0)
MCHC: 35.7 g/dL (ref 30.0–36.0)
MCV: 81.7 fL (ref 80.0–100.0)
Platelets: 299 10*3/uL (ref 150–400)
RBC: 4.97 MIL/uL (ref 4.22–5.81)
RDW: 12.5 % (ref 11.5–15.5)
WBC: 7.6 10*3/uL (ref 4.0–10.5)
nRBC: 0 % (ref 0.0–0.2)

## 2020-01-07 LAB — BASIC METABOLIC PANEL
Anion gap: 7 (ref 5–15)
BUN: 14 mg/dL (ref 6–20)
CO2: 29 mmol/L (ref 22–32)
Calcium: 9.5 mg/dL (ref 8.9–10.3)
Chloride: 102 mmol/L (ref 98–111)
Creatinine, Ser: 1.07 mg/dL (ref 0.61–1.24)
GFR calc Af Amer: >60 mL/min (ref >60)
GFR calc non Af Amer: >60 mL/min (ref >60)
Glucose, Bld: 107 mg/dL — ABNORMAL HIGH (ref 70–99)
Potassium: 4.1 mmol/L (ref 3.5–5.1)
Sodium: 138 mmol/L (ref 135–145)

## 2020-01-07 LAB — TROPONIN I (HIGH SENSITIVITY): Troponin I (High Sensitivity): <2 ng/L (ref <18)

## 2020-01-07 MED ORDER — SODIUM CHLORIDE 0.9% FLUSH: 3.0000 mL | Freq: Once | INTRAVENOUS | Status: DC

## 2020-01-07 NOTE — ED Triage Notes (Signed)
Pt to ED from home c/o mid chest pain that started approx 1-2hr PTA, states was stressing out when it started, denies SOB or n/v/d.  Pain is non radiating.  States stopped taking BP medication on his own 1 week ago.  Pt ambulatory steady gait, chest rise even and unlabored, in NAD at this time.

## 2020-01-08 ENCOUNTER — Emergency Department
Admission: EM | Admit: 2020-01-08 | Discharge: 2020-01-08 | Disposition: A | Discharge status: Home / Self Care | Payer: No Typology Code available for payment source | Attending: Emergency Medicine | Admitting: Emergency Medicine

## 2020-01-08 NOTE — ED Notes (Signed)
No answer when called several times from lobby 

## 2020-03-17 ENCOUNTER — Emergency Department
Admission: EM | Admit: 2020-03-17 | Discharge: 2020-03-17 | Disposition: A | Discharge status: Home / Self Care | Payer: No Typology Code available for payment source | Attending: Emergency Medicine | Admitting: Emergency Medicine

## 2020-03-17 DIAGNOSIS — R079 Chest pain, unspecified: Secondary | ICD-10-CM | POA: Diagnosis present

## 2020-03-17 DIAGNOSIS — Z5321 Procedure and treatment not carried out due to patient leaving prior to being seen by health care provider: Secondary | ICD-10-CM | POA: Diagnosis not present

## 2020-03-17 NOTE — ED Triage Notes (Signed)
Patient ambulatory to triage with steady gait, without difficulty or distress noted; pt reports mid CP radiating to left side x 6 months

## 2020-04-05 DIAGNOSIS — M542 Cervicalgia: Secondary | ICD-10-CM | POA: Insufficient documentation

## 2020-06-10 ENCOUNTER — Other Ambulatory Visit (HOSPITAL_COMMUNITY): Payer: Self-pay | Admitting: Nurse Practitioner

## 2020-06-10 ENCOUNTER — Other Ambulatory Visit: Payer: Self-pay | Admitting: Nurse Practitioner

## 2020-06-10 DIAGNOSIS — Z0189 Encounter for other specified special examinations: Secondary | ICD-10-CM

## 2020-06-10 DIAGNOSIS — M542 Cervicalgia: Secondary | ICD-10-CM

## 2020-06-19 ENCOUNTER — Emergency Department: Payer: No Typology Code available for payment source

## 2020-06-19 ENCOUNTER — Emergency Department
Admission: EM | Admit: 2020-06-19 | Discharge: 2020-06-20 | Disposition: A | Discharge status: Home / Self Care | Payer: No Typology Code available for payment source | Attending: Emergency Medicine | Admitting: Emergency Medicine

## 2020-06-19 ENCOUNTER — Other Ambulatory Visit: Payer: Self-pay

## 2020-06-19 DIAGNOSIS — M542 Cervicalgia: Secondary | ICD-10-CM | POA: Insufficient documentation

## 2020-06-19 DIAGNOSIS — Z5321 Procedure and treatment not carried out due to patient leaving prior to being seen by health care provider: Secondary | ICD-10-CM | POA: Diagnosis not present

## 2020-06-19 DIAGNOSIS — M25512 Pain in left shoulder: Secondary | ICD-10-CM | POA: Diagnosis present

## 2020-06-19 DIAGNOSIS — R079 Chest pain, unspecified: Secondary | ICD-10-CM | POA: Insufficient documentation

## 2020-06-19 HISTORY — DX: Radiculopathy, cervical region: M54.12

## 2020-06-19 LAB — BASIC METABOLIC PANEL
Anion gap: 10 (ref 5–15)
BUN: 23 mg/dL — ABNORMAL HIGH (ref 6–20)
CO2: 23 mmol/L (ref 22–32)
Calcium: 8.7 mg/dL — ABNORMAL LOW (ref 8.9–10.3)
Chloride: 102 mmol/L (ref 98–111)
Creatinine, Ser: 1.1 mg/dL (ref 0.61–1.24)
GFR, Estimated: >60 mL/min (ref >60)
Glucose, Bld: 103 mg/dL — ABNORMAL HIGH (ref 70–99)
Potassium: 3.1 mmol/L — ABNORMAL LOW (ref 3.5–5.1)
Sodium: 135 mmol/L (ref 135–145)

## 2020-06-19 LAB — CBC
HCT: 39.9 % (ref 39.0–52.0)
Hemoglobin: 14 g/dL (ref 13.0–17.0)
MCH: 29.2 pg (ref 26.0–34.0)
MCHC: 35.1 g/dL (ref 30.0–36.0)
MCV: 83.1 fL (ref 80.0–100.0)
Platelets: 272 10*3/uL (ref 150–400)
RBC: 4.8 MIL/uL (ref 4.22–5.81)
RDW: 12.1 % (ref 11.5–15.5)
WBC: 9.2 10*3/uL (ref 4.0–10.5)
nRBC: 0 % (ref 0.0–0.2)

## 2020-06-19 LAB — TROPONIN I (HIGH SENSITIVITY): Troponin I (High Sensitivity): <2 ng/L (ref <18)

## 2020-06-19 NOTE — ED Triage Notes (Signed)
PT to ED via POV with c/o L shoulder, neck and CP. PT was dx through MRI with cervical radiculopathy and intercostal muscle pain about 2 days ago. PT has been having these problems for months, is in physical therapy and takes meds but pain is unbearable this evening.

## 2020-06-29 ENCOUNTER — Ambulatory Visit: Admission: RE | Admit: 2020-06-29 | Payer: No Typology Code available for payment source | Source: Ambulatory Visit

## 2020-08-28 IMAGING — CR DG CHEST 2V
1 series · 2 of 2 positions shown · non-contrast
Comparison: 07/10/2019

CLINICAL DATA: Chest pain

EXAM:
CHEST - 2 VIEW

[Series 1: dg chest 2 view · 0.14mm/px · 2 of 2 slices shown]
[im 1/2]
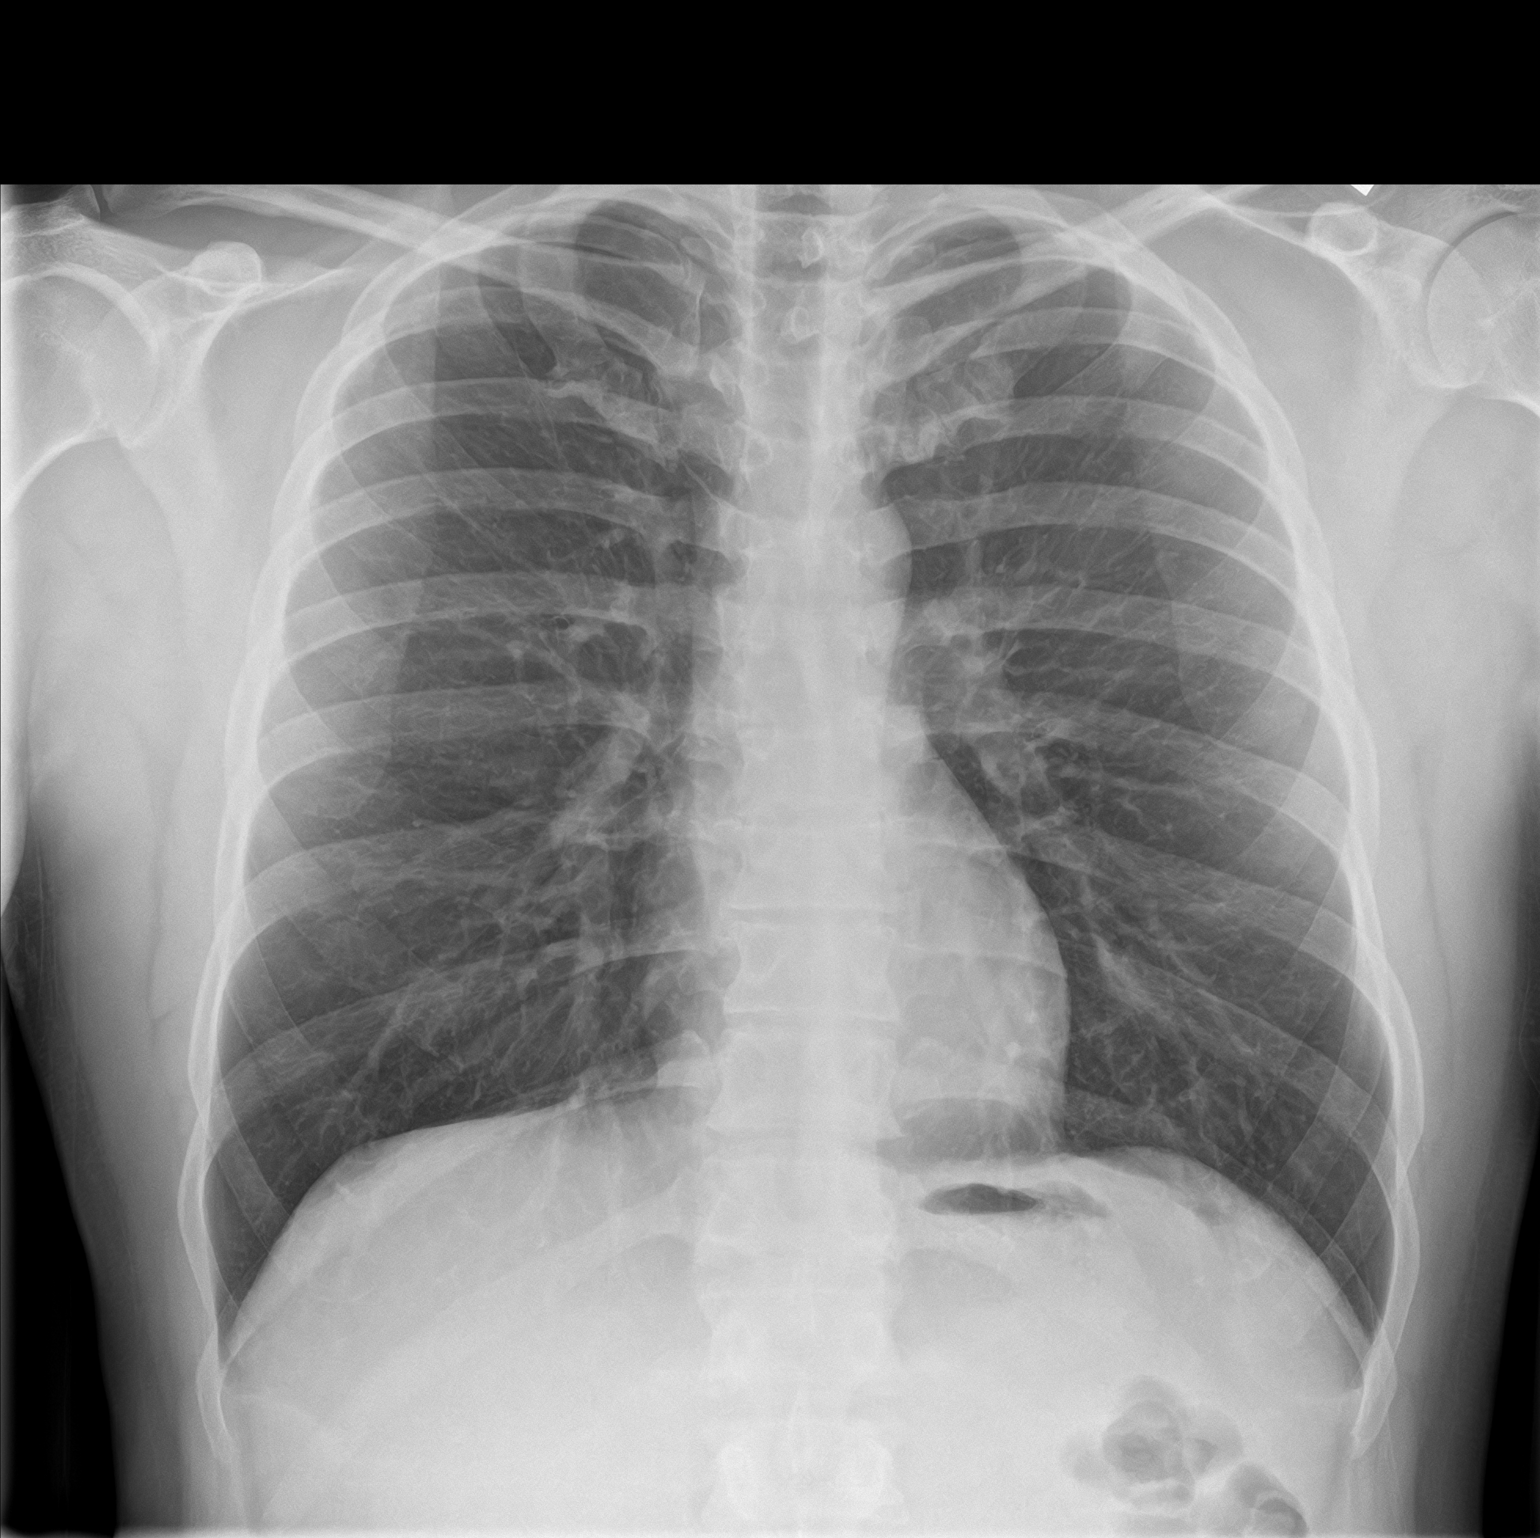
[im 2/2]
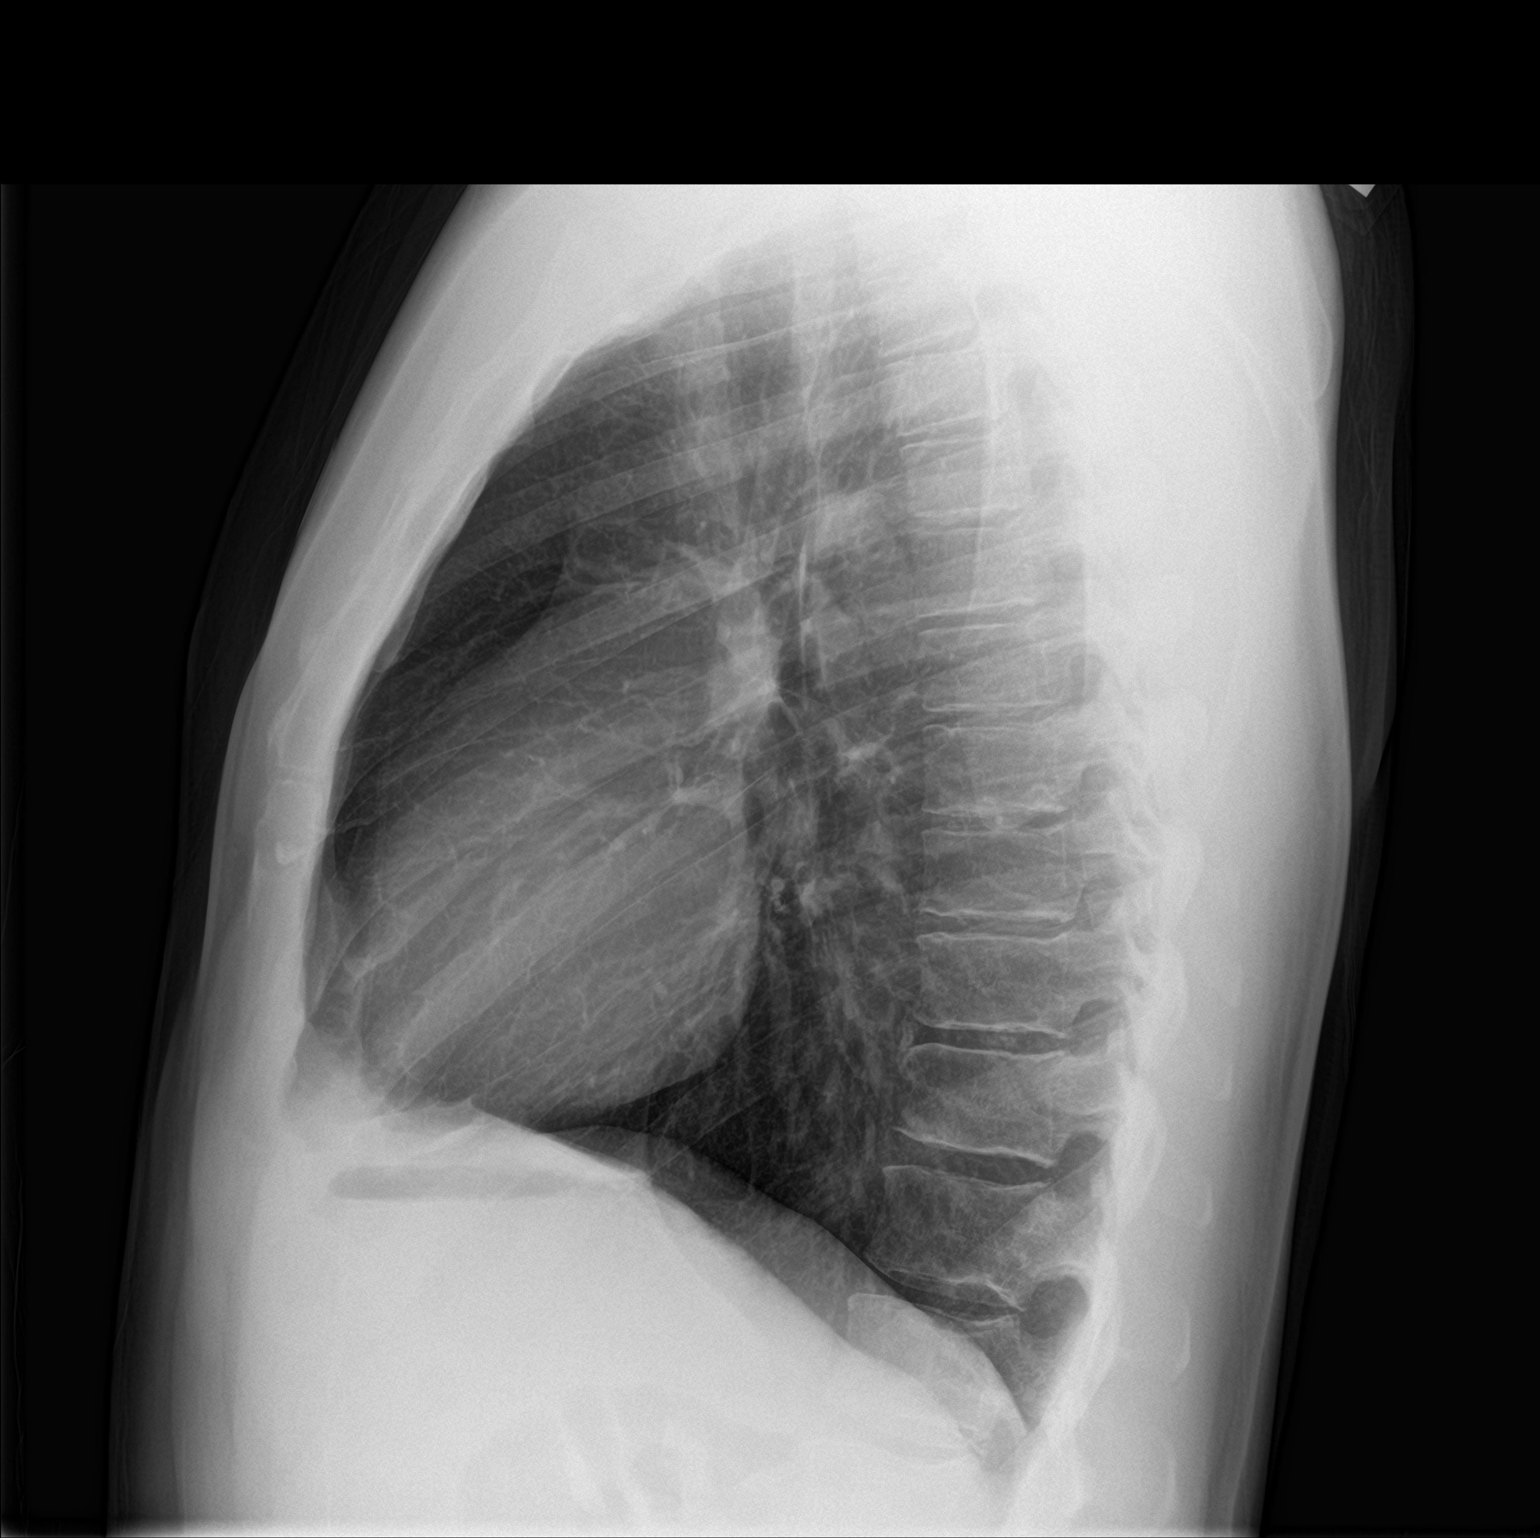

[2 of 2 positions shown; findings below may reference images not displayed]

FINDINGS: The heart size and mediastinal contours are within normal limits.
Both lungs are clear. The visualized skeletal structures are
unremarkable.
IMPRESSION: No active cardiopulmonary disease.

## 2020-09-01 IMAGING — CR DG CHEST 2V
1 series · 2 of 2 positions shown · non-contrast
Comparison: 12/25/2019

CLINICAL DATA: Pt arrives ambulatory to triage with c/o SOB and
possible Hypertension. Pt is able to talk in complete sentences at
this time with NAD

EXAM:
CHEST - 2 VIEW

[Series 1: dg chest 2 view · 0.14mm/px · 2 of 2 slices shown]
[im 1/2]
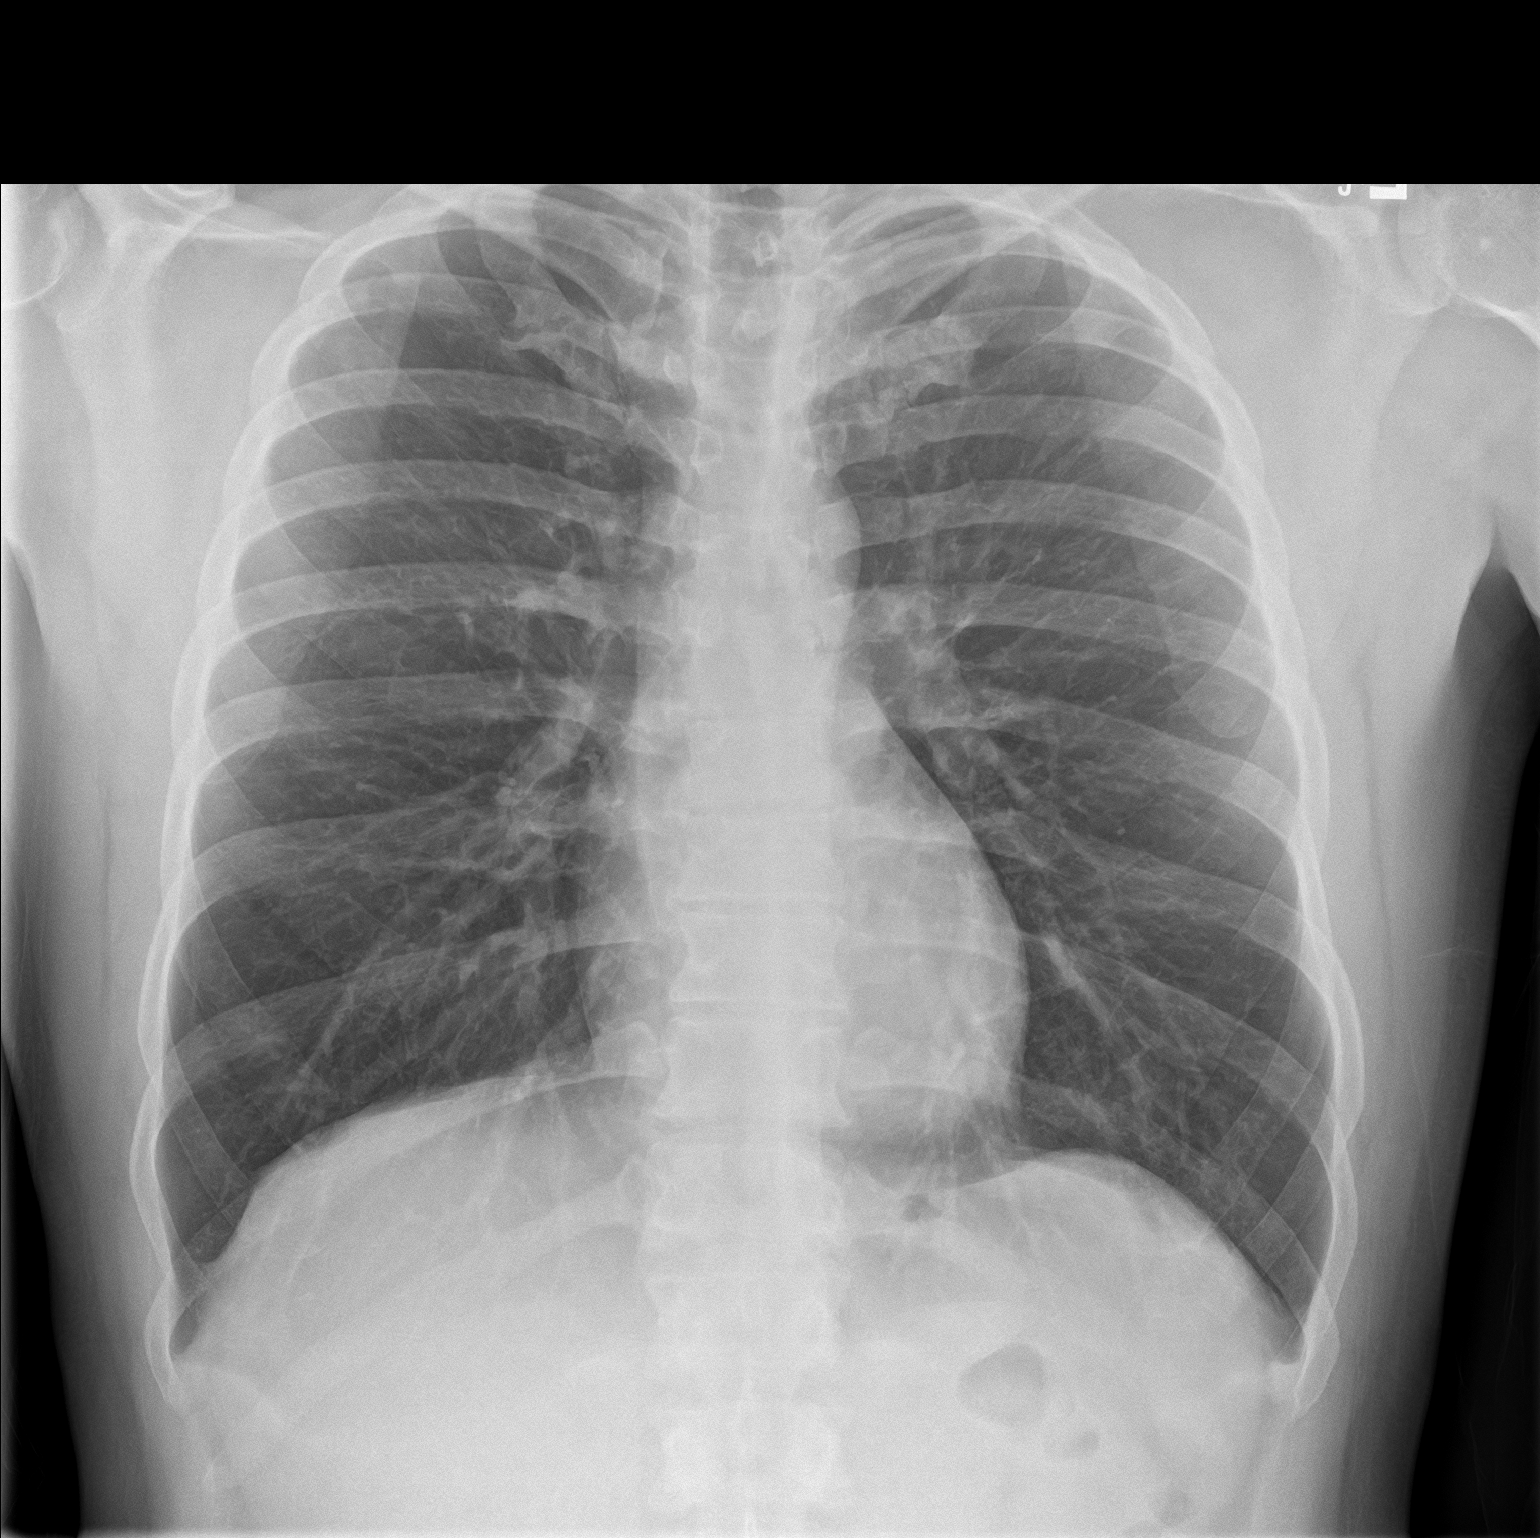
[im 2/2]
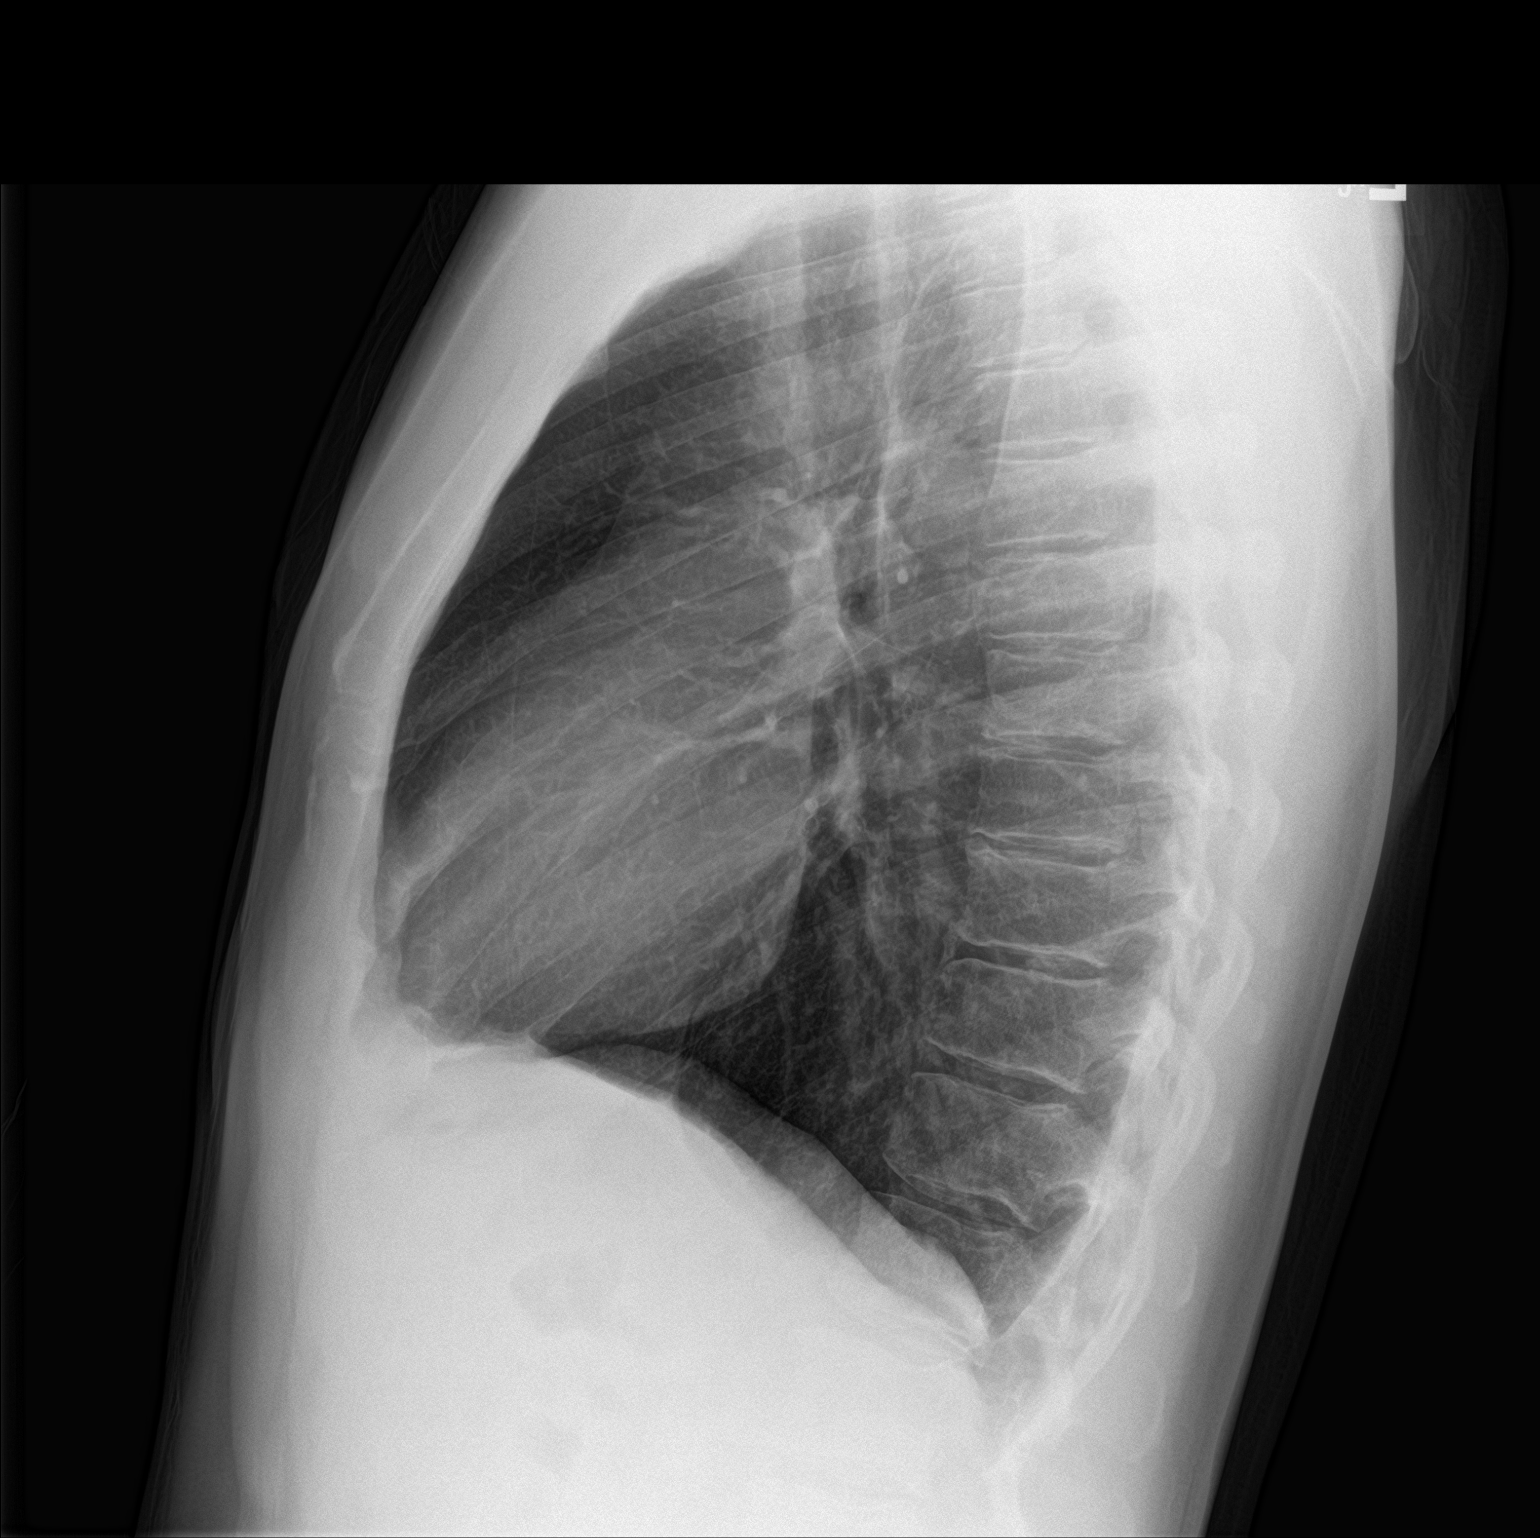

[2 of 2 positions shown; findings below may reference images not displayed]

FINDINGS: The heart size and mediastinal contours are within normal limits.
Both lungs are clear. No pleural effusion or pneumothorax. The
visualized skeletal structures are unremarkable.
IMPRESSION: No active cardiopulmonary disease.

## 2022-10-08 DIAGNOSIS — Z9049 Acquired absence of other specified parts of digestive tract: Secondary | ICD-10-CM | POA: Insufficient documentation

## 2023-08-07 ENCOUNTER — Emergency Department: Payer: Non-veteran care

## 2023-08-07 ENCOUNTER — Other Ambulatory Visit: Payer: Self-pay

## 2023-08-07 ENCOUNTER — Emergency Department
Admission: EM | Admit: 2023-08-07 | Discharge: 2023-08-07 | Disposition: A | Discharge status: Home / Self Care | Payer: Non-veteran care | Attending: Emergency Medicine | Admitting: Emergency Medicine

## 2023-08-07 DIAGNOSIS — R079 Chest pain, unspecified: Secondary | ICD-10-CM | POA: Diagnosis present

## 2023-08-07 DIAGNOSIS — Z20822 Contact with and (suspected) exposure to covid-19: Secondary | ICD-10-CM | POA: Insufficient documentation

## 2023-08-07 DIAGNOSIS — E876 Hypokalemia: Secondary | ICD-10-CM | POA: Insufficient documentation

## 2023-08-07 DIAGNOSIS — I10 Essential (primary) hypertension: Secondary | ICD-10-CM | POA: Insufficient documentation

## 2023-08-07 DIAGNOSIS — B349 Viral infection, unspecified: Secondary | ICD-10-CM | POA: Insufficient documentation

## 2023-08-07 LAB — CBC
HCT: 42.3 % (ref 39.0–52.0)
Hemoglobin: 14.1 g/dL (ref 13.0–17.0)
MCH: 28.7 pg (ref 26.0–34.0)
MCHC: 33.3 g/dL (ref 30.0–36.0)
MCV: 86 fL (ref 80.0–100.0)
Platelets: 286 10*3/uL (ref 150–400)
RBC: 4.92 MIL/uL (ref 4.22–5.81)
RDW: 12.8 % (ref 11.5–15.5)
WBC: 9.9 10*3/uL (ref 4.0–10.5)
nRBC: 0 % (ref 0.0–0.2)

## 2023-08-07 LAB — RESP PANEL BY RT-PCR (RSV, FLU A&B, COVID)  RVPGX2
Influenza A by PCR: NEGATIVE
Influenza B by PCR: NEGATIVE
Resp Syncytial Virus by PCR: NEGATIVE
SARS Coronavirus 2 by RT PCR: NEGATIVE

## 2023-08-07 LAB — BASIC METABOLIC PANEL
Anion gap: 12 (ref 5–15)
BUN: 14 mg/dL (ref 6–20)
CO2: 26 mmol/L (ref 22–32)
Calcium: 8.8 mg/dL — ABNORMAL LOW (ref 8.9–10.3)
Chloride: 101 mmol/L (ref 98–111)
Creatinine, Ser: 1.06 mg/dL (ref 0.61–1.24)
GFR, Estimated: >60 mL/min (ref >60)
Glucose, Bld: 108 mg/dL — ABNORMAL HIGH (ref 70–99)
Potassium: 3.3 mmol/L — ABNORMAL LOW (ref 3.5–5.1)
Sodium: 139 mmol/L (ref 135–145)

## 2023-08-07 LAB — TROPONIN I (HIGH SENSITIVITY)
Troponin I (High Sensitivity): 4 ng/L (ref <18)
Troponin I (High Sensitivity): 4 ng/L (ref <18)

## 2023-08-07 LAB — D-DIMER, QUANTITATIVE: D-Dimer, Quant: 0.31 ug{FEU}/mL (ref 0.00–0.50)

## 2023-08-07 MED ORDER — ALUM & MAG HYDROXIDE-SIMETH 200-200-20 MG/5ML PO SUSP
30.0000 mL | Freq: Once | ORAL | Status: AC
  Administered 2023-08-07: 30 mL via ORAL
  Filled 2023-08-07: qty 30

## 2023-08-07 MED ORDER — LIDOCAINE VISCOUS HCL 2 % MT SOLN
15.0000 mL | Freq: Once | OROMUCOSAL | Status: AC
  Administered 2023-08-07: 15 mL via OROMUCOSAL
  Filled 2023-08-07: qty 15

## 2023-08-07 MED ORDER — SODIUM CHLORIDE 0.9 % IV BOLUS
1000.0000 mL | Freq: Once | INTRAVENOUS | Status: AC
  Administered 2023-08-07: 1000 mL via INTRAVENOUS

## 2023-08-07 MED ORDER — HYDROCOD POLI-CHLORPHE POLI ER 10-8 MG/5ML PO SUER: 5.0000 mL | Freq: Two times a day (BID) | ORAL | 0 refills | Status: DC | PRN

## 2023-08-07 MED ORDER — POTASSIUM CHLORIDE CRYS ER 20 MEQ PO TBCR
40.0000 meq | EXTENDED_RELEASE_TABLET | Freq: Once | ORAL | Status: AC
  Administered 2023-08-07: 40 meq via ORAL
  Filled 2023-08-07: qty 2

## 2023-08-07 MED ORDER — HYDROCOD POLI-CHLORPHE POLI ER 10-8 MG/5ML PO SUER
5.0000 mL | Freq: Once | ORAL | Status: AC
  Administered 2023-08-07: 5 mL via ORAL
  Filled 2023-08-07: qty 5

## 2023-08-07 NOTE — ED Notes (Signed)
 Grey top on ice as well as one set of blood cultures have been sent to the lab to hold in case they are needed.

## 2023-08-07 NOTE — Discharge Instructions (Addendum)
You may take Tussionex as needed for cough. Return to the ER for worsening symptoms, persistent vomiting, difficulty breathing or other concerns. 

## 2023-08-07 NOTE — ED Provider Notes (Signed)
 [facility] Provider Note    Event Date/Time   First MD Initiated Contact with Patient 08/07/23 [phone]     (approximate)   History   Chest Pain   HPI  Galo Sayed is a 38 y.o. male brought to the ED via EMS from home with a chief complaint of chest pain and shortness of breath.  Patient with a history of hypertension who was in [place] earlier this month and was bitten on his abdomen by a tarantula.  He was seen at the [facility] yesterday, had area I&D'd and placed on Bactrim.  Reports a 3 to 4-day history of dry cough, congestion pain and shortness of breath.  Reports exposures to both pneumonia as well as COVID-19.  Denies associated abdominal pain, nausea, vomiting or dizziness.     Past Medical History   Past Medical History:  Diagnosis Date   Cervical radiculopathy    Heartburn    Hypertension      Active Problem List  There are no active problems to display for this patient.    Past Surgical History  History reviewed. No pertinent surgical history.   Home Medications   Prior to Admission medications   Medication Sig Start Date End Date Taking? Authorizing Provider  chlorpheniramine-HYDROcodone (TUSSIONEX) 10-8 MG/5ML Take 5 mLs by mouth every 12 (twelve) hours as needed for cough. 08/07/23  Yes Robinette Vermell PARAS, MD  albuterol  (PROVENTIL  HFA;VENTOLIN  HFA) 108 (90 Base) MCG/ACT inhaler Inhale 2 puffs into the lungs every 6 (six) hours as needed for wheezing or shortness of breath. 10/12/18   Dorothyann Drivers, MD     Allergies  Shrimp [shellfish allergy ]   Family History  History reviewed. No pertinent family history.   Physical Exam  Triage Vital Signs: ED Triage Vitals  Encounter Vitals Group     BP 08/07/23 0020 133/87     Systolic BP Percentile --      Diastolic BP Percentile --      Pulse Rate 08/07/23 0020 93     Resp 08/07/23 0020 17     Temp 08/07/23 0020 98.3 F (36.8 C)     Temp Source 08/07/23 0020 Oral      SpO2 08/07/23 0020 95 %     Weight 08/07/23 0018 177 lb (80.3 kg)     Height 08/07/23 0018 6' 1 (1.854 m)     Head Circumference --      Peak Flow --      Pain Score 08/07/23 0018 6     Pain Loc --      Pain Education --      Exclude from Growth Chart --     Updated Vital Signs: BP 112/70   Pulse 88   Temp 98.4 F (36.9 C) (Oral)   Resp 16   Ht 6' 1 (1.854 m)   Wt 80.3 kg   SpO2 100%   BMI 23.35 kg/m    General: Awake, mild distress.  CV:  RRR.  Good peripheral perfusion.  Resp:  Normal effort.  CTAB. Abd:  Nontender.  Healing wound central abdomen without surrounding warmth or erythema.  No distention.  Other:  Bilateral calves are supple and nontender.   ED Results / Procedures / Treatments  Labs (all labs ordered are listed, but only abnormal results are displayed) Labs Reviewed  BASIC METABOLIC PANEL - Abnormal; Notable for the following components:      Result Value   Potassium 3.3 (*)    Glucose,  Bld 108 (*)    Calcium 8.8 (*)    All other components within normal limits  RESP PANEL BY RT-PCR (RSV, FLU A&B, COVID)  [id number]  CBC  D-DIMER, QUANTITATIVE  TROPONIN I (HIGH SENSITIVITY)  TROPONIN I (HIGH SENSITIVITY)     EKG  ED ECG REPORT I, Peyson Delao J, the attending physician, personally viewed and interpreted this ECG.   Date: 08/07/2023  EKG Time: 0029  Rate: 93  Rhythm: normal sinus rhythm  Axis: Normal  Intervals:none  ST&T Change: Nonspecific    RADIOLOGY I have independently visualized and interpreted patient's imaging study as well as noted the radiology interpretation:  X-ray: No acute cardiopulmonary process  Official radiology report(s): DG Chest 2 View Result Date: 08/07/2023 CLINICAL DATA:  Chest pain. EXAM: CHEST - 2 VIEW COMPARISON:  June 19, 2020 FINDINGS: The heart size and mediastinal contours are within normal limits. Both lungs are clear. The visualized skeletal structures are unremarkable. IMPRESSION: No active  cardiopulmonary disease. Electronically Signed   By: Suzen Dials M.D.   On: 08/07/2023 00:55     PROCEDURES:  Critical Care performed: No  .1-3 Lead EKG Interpretation  Performed by: Robinette Vermell PARAS, MD Authorized by: Robinette Vermell PARAS, MD     Interpretation: normal     ECG rate:  90   ECG rate assessment: normal     Rhythm: sinus rhythm     Ectopy: none     Conduction: normal   Comments:     Patient placed on cardiac monitor to evaluate for arrhythmias    MEDICATIONS ORDERED IN ED: Medications  sodium chloride  0.9 % bolus 1,000 mL (0 mLs Intravenous Stopped 08/07/23 0147)  chlorpheniramine-HYDROcodone (TUSSIONEX) 10-8 MG/5ML suspension 5 mL (5 mLs Oral Given 08/07/23 0119)  potassium chloride  SA (KLOR-CON  M) CR tablet 40 mEq (40 mEq Oral Given 08/07/23 0119)  alum & mag hydroxide-simeth (MAALOX/MYLANTA) 200-200-20 MG/5ML suspension 30 mL (30 mLs Oral Given 08/07/23 0351)  lidocaine  (XYLOCAINE ) 2 % viscous mouth solution 15 mL (15 mLs Mouth/Throat Given 08/07/23 0351)     IMPRESSION / MDM / ASSESSMENT AND PLAN / ED COURSE  I reviewed the triage vital signs and the nursing notes.                             38 year old male presenting with chest pain and shortness of breath. Differential diagnosis includes, but is not limited to, ACS, aortic dissection, pulmonary embolism, cardiac tamponade, pneumothorax, pneumonia, pericarditis, myocarditis, GI-related causes including esophagitis/gastritis, and musculoskeletal chest wall pain.   I personally reviewed patient's records and [facility].  Patient's presentation is most consistent with acute illness / injury with system symptoms.  The patient is on the cardiac monitor to evaluate for evidence of arrhythmia and/or significant heart rate changes.  Laboratory results demonstrate normal WBC 9.9, mild hypokalemia with potassium 3.3, initial troponin negative.  Chest x-ray clear, respiratory panel is pending.  Given  patient's recent travel, will obtain D-dimer.  Initiate IV fluid hydration, Tussionex for cough.  Will reassess.  Clinical Course as of 08/07/23 0658  Mon Aug 07, 2023  0335 Patient sleeping in no acute distress.  Overall feeling better.  Updated him of negative repeat troponin and D-dimer.  Will discharge home with prescription for as needed Tussionex.  Strict return precautions given.  Patient verbalizes understanding and agrees with plan of care. [JS]    Clinical Course User Index [JS] Robinette,  Evolette Pendell J, MD     FINAL CLINICAL IMPRESSION(S) / ED DIAGNOSES   Final diagnoses:  Chest pain, unspecified type  Hypokalemia  Viral illness     Rx / DC Orders   ED Discharge Orders          Ordered    chlorpheniramine-HYDROcodone (TUSSIONEX) 10-8 MG/5ML  Every 12 hours PRN        08/07/23 0300             Note:  This document was prepared using Dragon voice recognition software and may include unintentional dictation errors.   Quantia Grullon J, MD 08/07/23 [phone]

## 2023-08-07 NOTE — ED Triage Notes (Signed)
 PER EMS: pt is from home, reports he was bitten by a spider earlier this month on his abdomen when in [place]. He was [facility]. He now reports chest pain and trouble breathing. He also reports feeling congested and has been exposed to pneumonia and covid.  BP-144/87, HR-91, O2-99% RA.

## 2023-12-05 NOTE — ED Provider Notes (Signed)
 CHIEF COMPLAINT:  Chief Complaint  Patient presents with  . Allergic Reaction     HPI  Damon Stevens is a 38 y.o. male who presents to the Emergency Department complaining of swelling of the right upper lip.  States it feels mostly of the upper lip.  No difficulty breathing or sore throat.  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  End up coming to the emergency department for further evaluation.  The patient states that he has had just orange juice this morning from the gas station.  No other contacts with allergens that he is aware of.  Itchiness to the face but otherwise no itchiness or hives that have developed over the extremities.  No new soaps detergents.  No wheezing no vomiting.  REVIEW OF SYSTEMS:  In addition to that documented in the HPI above, the additional ROS was obtained: Constitutional: Denies fevers or chills Eyes: Denies vision changes ENMT: Denies sore throat CV: Denies chest pain Resp: Denies SOB GI: Denies vomiting or diarrhea GU: Denies painful urination MSK: Denies recent trauma Skin: Denies new rashes Neuro: Denies new numbness or tingling or weakness All other review of systems negative  PAST MEDICAL HISTORY:   Past Medical History:  Diagnosis Date  . Bulge of cervical disc without myelopathy   . Cervicalgia   . Hypercholesteremia   . Hypertension     No past surgical history on file.   MEDICATIONS:  No current facility-administered medications for this encounter.   Current Outpatient Medications  Medication Sig Dispense Refill  . aspirin  (ECOTRIN) 81 MG tablet Take 81 mg by mouth daily. (Patient not taking: Reported on 06/13/2023)    . EPINEPHrine  (EPIPEN ) 0.3 mg/0.3 mL injection Inject 0.3 mL (0.3 mg total) into the muscle once for 1 dose. 1 each 0  . predniSONE  (DELTASONE ) 20 MG tablet Take 2 tablets (40 mg total) by mouth daily for 2 days. 4 tablet 0    ALLERGIES:  Allergies  Allergen Reactions  .  Ketorolac Palpitations and Anaphylaxis  . Shellfish Containing Products Shortness Of Breath and Other (See Comments)    Other reaction(s): Hypertension  . Shrimp Swelling      FAMILY HISTORY:  No family history on file.  SOCIAL HISTORY: Social History   Tobacco Use  . Smoking status: Never  . Smokeless tobacco: Never  Vaping Use  . Vaping status: Never Used  Substance Use Topics  . Alcohol use: Not Currently  . Drug use: Never    PHYSICAL EXAM: VITAL SIGNS: BP 136/83   Pulse 77   Temp 37 C (98.6 F) (Oral)   Resp 13   Ht 182.9 cm (6')   Wt 80.3 kg (177 lb)   SpO2 99%   BMI 24.01 kg/m  General: Awake and alert, no acute distress lying in the bed Eyes: Normal appearance, no icterus noted, extraocular movements are intact, pupils are equal and reactive HENT: NCAT. Oropharynx clear, mucous membranes are moist.  Posterior oropharynx is clear, there is edema to the right upper lip only, no soft or hard palate edema no tongue edema no muffled voice Neck: Supple, normal ROM. Cardiovascular: Regular rate and rhythm, normal S1 and S2. Pulmonology: Normal work of breathing, clear to auscultation bilaterally. Abdomen: Nontender and nondistended, no rebound or guarding GU: Deferred Back: No masses or obvious deformity. Musculoskeletal: No obvious deformity.  Normal range of motion of the joints Skin: Warm, dry, no rash. Lymphatic: No cervical lymphadenopathy Neurologic:  GCS 15. Alert and oriented x3, able to ambulate Psychiatric: Cooperative, mental status and affect normal, normal speech pattern and content.   LABS:  Labs Reviewed - No data to display  RADIOLOGY: No orders to display    EKG: No results found for this visit on 12/05/23 (from the past 4464 hours).  ED COURSE/MDM:  Outside medical record review: Per chart review: Patient with most recent outside hospital evaluation at [facility] 08/07/2023.  Patient at time was evaluated for chest pain.  Patient's got a  history of heartburn hypertension.    Outside result review: Patient with most recent outside hospital laboratories 10/08/2022 with a CMP creatinine of 1.11 and a CBC with white count 9.9 and hemoglobin 14.6  Differential Diagnosis: Patient presents with upper lip edema.  Concern for angioedema.  No history of hereditary angioedema in the family use suspect is also not go to be associated with ACE inhibitor induced as the patient's not on any lisinopril.  Suspect could be allergic  ED Course: Patient was seen and examined meeting was put to the room.  Will go ahead and get an IV.  Will go ahead and given some epinephrine  Solu-Medrol  Pepcid  Benadryl .  Will recheck shortly.  ED Course as of 12/05/23 1339  Tue Dec 05, 2023  1251 I did recheck on the patient.  Patient states swelling seems to be going down.  Will continue to observe   137: Patient's feeling almost back to baseline.  I going to give him a prescription of epinephrine  I recommended he follow-up with his primary care provider and may need to see an allergist.  Patient was ultimately discharged home in stable condition.  Medications administered during this encounter: Medications  methylPREDNISolone  sodium succinate (PF) (SOLU-Medrol ) injection 125 mg (125 mg Intravenous Given 12/05/23 1206)  EPINEPHrine  (ADRENALIN ) injection 0.3 mg (0.3 mg Intramuscular Given 12/05/23 1201)  diphenhydrAMINE  (BENADRYL ) injection (25 mg Intravenous Given 12/05/23 1202)  famotidine  (PF) (PEPCID ) injection 20 mg (20 mg Intravenous Given 12/05/23 1203)    IMPRESSION:  1. Angioedema, initial encounter   2. Allergic reaction, initial encounter     DISPOSITION: Discharge home, stable condition  FOLLOW-UP: [place], [facility] [address] ATTN: MEDICAL [place] [id number] [phone]  Call in 3 days Please talk to your PCP to get a referral to an allergist for allergy testing   DISCHARGE MEDICATIONS: New Prescriptions    EPINEPHRINE  (EPIPEN ) 0.3 MG/0.3 ML INJECTION    Inject 0.3 mL (0.3 mg total) into the muscle once for 1 dose.   PREDNISONE  (DELTASONE ) 20 MG TABLET    Take 2 tablets (40 mg total) by mouth daily for 2 days.     Antonetta Sherlean Croak, MD 12/05/23 1339

## 2023-12-28 NOTE — ED Provider Notes (Signed)
 ------------------------------------------------------------------------------- Attestation signed by Damon Camellia Mussel, MD at 12/28/23 2351 I personally reviewed the note written by the PA and take responsibility for the management plan.  -------------------------------------------------------------------------------  [facility] Emergency Department Provider Note  ED Clinical Impression    Final diagnoses:  Allergic reaction, initial encounter (Primary)     Initial ED Assessment/Plan   Damon Stevens is a 38 y.o. male with PMHx of HTN and hypercholesteremia who presents to the ED for evaluation of an allergic reaction, which began 4 hours ago.   Triage vital signs were stable and relatively WNL.  On physical exam, pt is alert and oriented, resting comfortably in bed, speaking full sentences and in no acute distress.  At time of evaluation, patient's allergic reaction symptoms have significantly improved.   Encouraged symptomatic care at home with continued use of Claritin, Pepcid  as needed.  Patient mentioned that he does not really know how to use his EpiPen , encouraged him to watch YouTube video, or other resources to ensure that he feels comfortable using it.  ED return precautions discussed, patient discharged in good condition.   History    Reason for Visit Allergic Reaction   HPI  Damon Stevens is a 38 y.o. male with PMHx of HTN and hypercholesteremia who presents to the ED for evaluation of an allergic reaction, which began 4 hours ago.  Pt reports that he accidentally ate something with eggs in it, causing an allergic reaction. Pt states that he felt his throat ache and that his tongue feels weird. He took a Claritin 30 minutes before coming to the ED. At the time of examination, pt denies rash, SOB, throat swelling, and itchiness. States that his symptoms are much improved.   Outside Historian(s) None  External Records Reviewed None  Past  Medical History:  Diagnosis Date  . Bulge of cervical disc without myelopathy   . Cervicalgia   . Hypercholesteremia   . Hypertension     Patient Active Problem List  Diagnosis  . S/P laparoscopic appendectomy    No past surgical history on file.  No current facility-administered medications for this encounter.  Current Outpatient Medications:  .  aspirin  (ECOTRIN) 81 MG tablet, Take 81 mg by mouth daily. (Patient not taking: Reported on 06/13/2023), Disp: , Rfl:  .  EPINEPHrine  (EPIPEN ) 0.3 mg/0.3 mL injection, Inject 0.3 mL (0.3 mg total) into the muscle once for 1 dose., Disp: 1 each, Rfl: 0  Allergies Eggshell membrane, Ketorolac, Shellfish containing products, and Shrimp  History reviewed. No pertinent family history.  Social History   Tobacco Use  . Smoking status: Never  . Smokeless tobacco: Never  Vaping Use  . Vaping status: Never Used  Substance Use Topics  . Alcohol use: Not Currently  . Drug use: Never     Physical Exam   ED Triage Vitals [12/28/23 1817]  Enc Vitals Group     BP 134/93     Pulse 87     SpO2 Pulse      Resp 18     Temp 36.2 C (97.1 F)     Temp Source Temporal     SpO2 100 %     Weight 80.3 kg (177 lb)     Height 1.829 m (6')    Constitutional: Alert and oriented. Well appearing and in no distress. Body mass index is 24.01 kg/m. HEENT Head: Normocephalic and atraumatic. Nose: No congestion. Mouth/Throat: Normal voice. Mucous membranes are moist. Oropharynx normal Neck: No stridor. Hematologic/Lymphatic/Immunologic:  No cervical lymphadenopathy. Cardiovascular: Normal rate, regular rhythm. No murmur.  Good distal peripheral pulses. No LE edema. Respiratory: Normal respiratory effort. CTAB with no rales, rhonchi, or wheeze.  Good air movement. Gastrointestinal: Soft and nontender Musculoskeletal: Normal range of motion in all extremities. Neurologic: Normal speech and language. No gross focal neurologic deficits are  appreciated. Motor 5/5. Sensation intact.  CN II-XII grossly intact.  No cerebellar signs. Skin: Skin is warm, dry and intact. No rash noted. Psychiatric: Mood and affect are normal. Speech and behavior are normal.   Labs   Results for orders placed or performed during the hospital encounter of 12/28/23  GREEN LITHIUM HEPARIN EXTRA TUBE  Result Value Ref Range   Extra Tube Green Lithium Heparin      Radiology   No orders to display    MDM      Discussion with other professionals: None Independent interpretation: None I have reviewed recent and relavant previous record, including: None Escalation of Care including OBS/Admission/Transfer was considered: None, patient safe for discharge home Social Determinants that significantly affected care: None Prescription drugs considered but not prescribed: None Diagnostic tests considered but not performed: None   Attestations   Portions of this record have been created using [employer]. Dictation errors have been sought, but may not have been identified and corrected.  The chart was initially created by Damon Stevens using scribe services on December 28, 2023 9:32 PM, on behalf of Damon Munden, PA-C.  Documentation assistance was provided by the scribe in my presence. The documentation recorded by the scribe has been reviewed by me, amended with [employer], and accurately reflects the services I personally performed.    Stevens, Damon Dasher, [place] 12/28/23 2349

## 2024-01-08 ENCOUNTER — Other Ambulatory Visit: Payer: Self-pay

## 2024-01-08 ENCOUNTER — Emergency Department
Admission: EM | Admit: 2024-01-08 | Discharge: 2024-01-08 | Disposition: A | Discharge status: Home / Self Care | Attending: Emergency Medicine | Admitting: Emergency Medicine

## 2024-01-08 DIAGNOSIS — R21 Rash and other nonspecific skin eruption: Secondary | ICD-10-CM | POA: Diagnosis present

## 2024-01-08 DIAGNOSIS — T7840XA Allergy, unspecified, initial encounter: Secondary | ICD-10-CM | POA: Insufficient documentation

## 2024-01-08 DIAGNOSIS — I10 Essential (primary) hypertension: Secondary | ICD-10-CM | POA: Insufficient documentation

## 2024-01-08 DIAGNOSIS — L509 Urticaria, unspecified: Secondary | ICD-10-CM | POA: Insufficient documentation

## 2024-01-08 LAB — CBC
HCT: 39.6 % (ref 39.0–52.0)
Hemoglobin: 13.4 g/dL (ref 13.0–17.0)
MCH: 28.6 pg (ref 26.0–34.0)
MCHC: 33.8 g/dL (ref 30.0–36.0)
MCV: 84.4 fL (ref 80.0–100.0)
Platelets: 241 10*3/uL (ref 150–400)
RBC: 4.69 MIL/uL (ref 4.22–5.81)
RDW: 12.7 % (ref 11.5–15.5)
WBC: 8 10*3/uL (ref 4.0–10.5)
nRBC: 0 % (ref 0.0–0.2)

## 2024-01-08 LAB — BASIC METABOLIC PANEL WITH GFR
Anion gap: 10 (ref 5–15)
BUN: 20 mg/dL (ref 6–20)
CO2: 25 mmol/L (ref 22–32)
Calcium: 8.7 mg/dL — ABNORMAL LOW (ref 8.9–10.3)
Chloride: 105 mmol/L (ref 98–111)
Creatinine, Ser: 0.98 mg/dL (ref 0.61–1.24)
GFR, Estimated: >60 mL/min (ref >60)
Glucose, Bld: 103 mg/dL — ABNORMAL HIGH (ref 70–99)
Potassium: 3.5 mmol/L (ref 3.5–5.1)
Sodium: 140 mmol/L (ref 135–145)

## 2024-01-08 MED ORDER — SODIUM CHLORIDE 0.9 % IV BOLUS
1000.0000 mL | Freq: Once | INTRAVENOUS | Status: AC
  Administered 2024-01-08: 1000 mL via INTRAVENOUS

## 2024-01-08 MED ORDER — EPINEPHRINE 0.3 MG/0.3ML IJ SOAJ: 0.3000 mg | Freq: Once | INTRAMUSCULAR | 0 refills | Status: AC

## 2024-01-08 MED ORDER — DIPHENHYDRAMINE HCL 50 MG/ML IJ SOLN
25.0000 mg | Freq: Once | INTRAMUSCULAR | Status: AC
  Administered 2024-01-08: 25 mg via INTRAVENOUS
  Filled 2024-01-08: qty 1

## 2024-01-08 MED ORDER — FAMOTIDINE IN NACL 20-0.9 MG/50ML-% IV SOLN
20.0000 mg | Freq: Once | INTRAVENOUS | Status: AC
  Administered 2024-01-08: 20 mg via INTRAVENOUS
  Filled 2024-01-08: qty 50

## 2024-01-08 MED ORDER — METHYLPREDNISOLONE SODIUM SUCC 125 MG IJ SOLR
125.0000 mg | Freq: Once | INTRAMUSCULAR | Status: AC
  Administered 2024-01-08: 125 mg via INTRAVENOUS
  Filled 2024-01-08: qty 2

## 2024-01-08 MED ORDER — EPINEPHRINE 0.3 MG/0.3ML IJ SOAJ
0.3000 mg | Freq: Once | INTRAMUSCULAR | Status: DC
  Filled 2024-01-08: qty 0.3

## 2024-01-08 MED ORDER — FAMOTIDINE 20 MG PO TABS: 20.0000 mg | ORAL_TABLET | Freq: Two times a day (BID) | ORAL | 0 refills | Status: DC

## 2024-01-08 MED ORDER — PREDNISONE 20 MG PO TABS: ORAL_TABLET | ORAL | 0 refills | Status: DC

## 2024-01-08 NOTE — ED Provider Notes (Signed)
-----------------------------------------   8:52 AM on 01/08/2024 -----------------------------------------  On reassessment, the patient is resting comfortably and is asymptomatic.  He is stable for discharge home at this time.  Return precautions given, and he expresses understanding.   Lind Repine, MD 01/08/24 [phone]

## 2024-01-08 NOTE — ED Provider Notes (Signed)
 [facility] Provider Note    Event Date/Time   First MD Initiated Contact with Patient 01/08/24 [phone]     (approximate)   History   Rash and Sore Throat   HPI  Damon Stevens is a 38 y.o. male who presents to the ED from home with a chief complaint of itching all over, sensation of tongue burning, throat constriction.  Similar symptoms previously.  Ate chicken for dinner which he has done multiple times before.  Denies new foods, medications including over-the-counter's, environmental exposures.  Denies fever/chills, chest pain, shortness of breath, abdominal pain, nausea, vomiting or diarrhea.     Past Medical History   Past Medical History:  Diagnosis Date   Cervical radiculopathy    Heartburn    Hypertension      Active Problem List  There are no active problems to display for this patient.    Past Surgical History  History reviewed. No pertinent surgical history.   Home Medications   Prior to Admission medications   Medication Sig Start Date End Date Taking? Authorizing Provider  EPINEPHrine 0.3 mg/0.3 mL IJ SOAJ injection Inject 0.3 mg into the muscle once for 1 dose. 01/08/24 01/08/24 Yes Shaiden Aldous J, MD  famotidine (PEPCID) 20 MG tablet Take 1 tablet (20 mg total) by mouth 2 (two) times daily. 01/08/24  Yes Annastasia Haskins J, MD  predniSONE (DELTASONE) 20 MG tablet 3 tablets daily x 4 days 01/08/24  Yes Tarahji Ramthun J, MD  albuterol  (PROVENTIL  HFA;VENTOLIN  HFA) 108 (90 Base) MCG/ACT inhaler Inhale 2 puffs into the lungs every 6 (six) hours as needed for wheezing or shortness of breath. 10/12/18   Ruth Cove, MD  chlorpheniramine-HYDROcodone (TUSSIONEX) 10-8 MG/5ML Take 5 mLs by mouth every 12 (twelve) hours as needed for cough. 08/07/23   Vaneta Hammontree J, MD     Allergies  Egg-derived products, Shrimp [shellfish allergy], and Toradol [ketorolac tromethamine]   Family History  History reviewed. No pertinent family  history.   Physical Exam  Triage Vital Signs: ED Triage Vitals  Encounter Vitals Group     BP 01/08/24 0519 (!) 138/97     Girls Systolic BP Percentile --      Girls Diastolic BP Percentile --      Boys Systolic BP Percentile --      Boys Diastolic BP Percentile --      Pulse Rate 01/08/24 0519 82     Resp 01/08/24 0519 18     Temp 01/08/24 0519 97.8 F (36.6 C)     Temp Source 01/08/24 0519 Oral     SpO2 01/08/24 0519 100 %     Weight 01/08/24 0520 177 lb 0.5 oz (80.3 kg)     Height 01/08/24 0520 6' (1.829 m)     Head Circumference --      Peak Flow --      Pain Score 01/08/24 0520 6     Pain Loc --      Pain Education --      Exclude from Growth Chart --     Updated Vital Signs: BP 120/84   Pulse 66   Temp 97.8 F (36.6 C) (Oral)   Resp 16   Ht 6' (1.829 m)   Wt 80.3 kg   SpO2 99%   BMI 24.01 kg/m    General: Awake, moderate distress.  CV:  RRR.  Good peripheral perfusion.  Resp:  Normal effort.  CTAB. Abd:  Nontender.  No distention.  Other:  Generalized erythema and urticaria.  There is no angioedema.  Posterior oropharynx clear.  Phonation intact.  Tolerating secretions well.   ED Results / Procedures / Treatments  Labs (all labs ordered are listed, but only abnormal results are displayed) Labs Reviewed  BASIC METABOLIC PANEL WITH GFR - Abnormal; Notable for the following components:      Result Value   Glucose, Bld 103 (*)    Calcium 8.7 (*)    All other components within normal limits  CBC     EKG  None   RADIOLOGY None   Official radiology report(s): No results found.   PROCEDURES:  Critical Care performed: No  .1-3 Lead EKG Interpretation  Performed by: Norlene Beavers, MD Authorized by: Norlene Beavers, MD     Interpretation: normal     ECG rate:  70   ECG rate assessment: normal     Rhythm: sinus rhythm     Ectopy: none     Conduction: normal   Comments:     Patient placed on cardiac monitor to evaluate for  arrhythmias    MEDICATIONS ORDERED IN ED: Medications  EPINEPHrine (EPI-PEN) injection 0.3 mg (0 mg Intramuscular Hold 01/08/24 0540)  sodium chloride  0.9 % bolus 1,000 mL (1,000 mLs Intravenous New Bag/Given 01/08/24 0538)  methylPREDNISolone sodium succinate (SOLU-MEDROL) 125 mg/2 mL injection 125 mg (125 mg Intravenous Given 01/08/24 0540)  famotidine (PEPCID) IVPB 20 mg premix (0 mg Intravenous Stopped 01/08/24 0614)  diphenhydrAMINE (BENADRYL) injection 25 mg (25 mg Intravenous Given 01/08/24 0540)     IMPRESSION / MDM / ASSESSMENT AND PLAN / ED COURSE  I reviewed the triage vital signs and the nursing notes.                             38 year old male presenting with urticaria, difficulty breathing.  Will administer anaphylaxis cocktail consisting of IV fluids, Benadryl, Solu-Medrol, Pepcid and EpiPen.  Will continue to monitor and care for patient.  Patient's presentation is most consistent with acute presentation with potential threat to life or bodily function.  The patient is on the cardiac monitor to evaluate for evidence of arrhythmia and/or significant heart rate changes.   Clinical Course as of 01/08/24 0656  Mon Jan 08, 2024  5188 Patient declined EpiPen, states it gives him bad side effects.  He knows he will need to receive it if his condition worsens. [JS]  [id number] Patient resting in no acute distress.  Care transferred to the oncoming provider at change of shift.  Anticipate discharge home pending reassessment around 8:40 AM. [JS]    Clinical Course User Index [JS] Norlene Beavers, MD     FINAL CLINICAL IMPRESSION(S) / ED DIAGNOSES   Final diagnoses:  Urticaria  Allergic reaction, initial encounter     Rx / DC Orders   ED Discharge Orders          Ordered    EPINEPHrine 0.3 mg/0.3 mL IJ SOAJ injection   Once        01/08/24 0556    predniSONE (DELTASONE) 20 MG tablet        01/08/24 0556    famotidine (PEPCID) 20 MG tablet  2 times daily        01/08/24  0556             Note:  This document was prepared using Dragon voice recognition software and may include unintentional dictation errors.   Vallery Gavel,  Jiovanny Burdell J, MD 01/08/24 [phone]

## 2024-01-08 NOTE — Discharge Instructions (Signed)
1. Take the following medicines for the next 4 days: Prednisone 60mg daily Pepcid 20mg twice daily 2. Take Benadryl as needed for itching. 3. Use Epi-Pen in case of acute, life-threatening allergic reaction. 4. Return to the ER for worsening symptoms, persistent vomiting, difficulty breathing or other concerns.  

## 2024-01-08 NOTE — ED Triage Notes (Signed)
 POV with CC of itching all over and sore throat that started last night with burning tongue. Took 25mg  Benadryl at 0500.

## 2024-01-12 ENCOUNTER — Emergency Department
Admission: EM | Admit: 2024-01-12 | Discharge: 2024-01-12 | Disposition: A | Discharge status: Home / Self Care | Attending: Emergency Medicine | Admitting: Emergency Medicine

## 2024-01-12 ENCOUNTER — Other Ambulatory Visit: Payer: Self-pay

## 2024-01-12 DIAGNOSIS — T7840XA Allergy, unspecified, initial encounter: Secondary | ICD-10-CM | POA: Diagnosis present

## 2024-01-12 DIAGNOSIS — I10 Essential (primary) hypertension: Secondary | ICD-10-CM | POA: Diagnosis not present

## 2024-01-12 MED ORDER — DIPHENHYDRAMINE HCL 50 MG/ML IJ SOLN
25.0000 mg | Freq: Once | INTRAMUSCULAR | Status: AC
  Administered 2024-01-12: 25 mg via INTRAVENOUS
  Filled 2024-01-12: qty 1

## 2024-01-12 MED ORDER — FAMOTIDINE IN NACL 20-0.9 MG/50ML-% IV SOLN
20.0000 mg | Freq: Once | INTRAVENOUS | Status: AC
  Administered 2024-01-12: 20 mg via INTRAVENOUS
  Filled 2024-01-12: qty 50

## 2024-01-12 MED ORDER — METHYLPREDNISOLONE SODIUM SUCC 125 MG IJ SOLR
125.0000 mg | Freq: Once | INTRAMUSCULAR | Status: AC
  Administered 2024-01-12: 125 mg via INTRAVENOUS
  Filled 2024-01-12: qty 2

## 2024-01-12 NOTE — Discharge Instructions (Addendum)
 Continue taking the prednisone as previously prescribed.  Call allergist for a follow-up appointment.  Call your primary doctor for follow-up appointment.  [facility] at [facility] [phone] [address] [place], [place] [address]  Thank you for choosing us  for your health care today!  Please see your primary doctor this week for a follow up appointment.   If you have any new, worsening, or unexpected symptoms call your doctor right away or come back to the emergency department for reevaluation.  It was my pleasure to care for you today.   Arron Large Margery Sheets, MD

## 2024-01-12 NOTE — ED Triage Notes (Addendum)
 Patient C/O allergic reaction. 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 He is C/O feeling like his throat is closing. He does have an epi pen at home, but states that he did not use it. He did take 25mg  of benadryl before arriving. He also states he took 20mg  of prednisone.

## 2024-01-12 NOTE — ED Provider Notes (Signed)
 [facility] Provider Note    Event Date/Time   First MD Initiated Contact with Patient 01/12/24 0157     (approximate)   History   No chief complaint on file.   HPI  Damon Stevens is a 38 y.o. male   Past medical history of heartburn and allergic reactions, hypertension, who presents to Emergency Department with allergic reaction.  He thinks he ate something with eggs inside he knows he is allergic to eggs.  This happened earlier this evening.  He had a sensation of throat itching and closing up.  He took Benadryl and a dose of prednisone for which he was prescribed for allergic reaction recently and that since then his symptoms have markedly improved already.  He denies GI symptoms, respiratory symptoms, or skin rash.  No other new exposures noted by patient.  He has been working to get in with the allergist but has had difficulty getting in touch with 1 from the [place].  External Medical Documents Reviewed: Outside hospital emergency department visit at [facility] on 12/28/2022 for allergic reaction after he ate some eggs causing allergic reaction, and got better after his self-administered Claritin      Physical Exam   Triage Vital Signs: ED Triage Vitals [01/12/24 0149]  Encounter Vitals Group     BP (!) 145/101     Girls Systolic BP Percentile      Girls Diastolic BP Percentile      Boys Systolic BP Percentile      Boys Diastolic BP Percentile      Pulse Rate 91     Resp 19     Temp 97.8 F (36.6 C)     Temp Source Oral     SpO2 100 %     Weight      Height      Head Circumference      Peak Flow      Pain Score      Pain Loc      Pain Education      Exclude from Growth Chart     Most recent vital signs: Vitals:   01/12/24 0149  BP: (!) 145/101  Pulse: 91  Resp: 19  Temp: 97.8 F (36.6 C)  SpO2: 100%    General: Awake, no distress.  CV:  Good peripheral perfusion.  Resp:  Normal effort.  Abd:  No distention.  Other:  Awake  alert comfortable appearing no acute distress with normal vital signs.  No intraoral lesions swelling noted.  No skin rash noted.  Airway intact.  Breathing comfortably and phonation is normal.  He has no tenderness to the abdomen he has clear lungs.   ED Results / Procedures / Treatments   Labs (all labs ordered are listed, but only abnormal results are displayed) Labs Reviewed - No data to display  PROCEDURES:  Critical Care performed: No  Procedures   MEDICATIONS ORDERED IN ED: Medications  diphenhydrAMINE (BENADRYL) injection 25 mg (25 mg Intravenous Given 01/12/24 0201)  famotidine (PEPCID) IVPB 20 mg premix (20 mg Intravenous New Bag/Given 01/12/24 0207)  methylPREDNISolone sodium succinate (SOLU-MEDROL) 125 mg/2 mL injection 125 mg (125 mg Intravenous Given 01/12/24 0200)    IMPRESSION / MDM / ASSESSMENT AND PLAN / ED COURSE  I reviewed the triage vital signs and the nursing notes.  Patient's presentation is most consistent with acute presentation with potential threat to life or bodily function.  Differential diagnosis includes, but is not limited to, allergic reaction, airway obstruction, angioedema, anaphylaxis   MDM: Exposure to known allergen egg and he had throat itching and sensation of throat closing but fortunately has mostly subsided with his self-administered prednisone at home and 25 mg of Benadryl.  Exam very benign now with no evidence of allergic reactions and certainly does not meet anaphylaxis and airway is very intact and patent.  Will give him some extra steroids, extra antihistamines and observe and if continues to get better I will have him discharged.  He has an EpiPen at home.  I will give him contact information for allergist.      FINAL CLINICAL IMPRESSION(S) / ED DIAGNOSES   Final diagnoses:  Allergic reaction, initial encounter     Rx / DC Orders   ED Discharge Orders     None        Note:  This  document was prepared using Dragon voice recognition software and may include unintentional dictation errors.    Buell Carmin, MD 01/12/24 [phone]

## 2024-01-16 ENCOUNTER — Other Ambulatory Visit: Payer: Self-pay

## 2024-01-16 ENCOUNTER — Emergency Department
Admission: EM | Admit: 2024-01-16 | Discharge: 2024-01-16 | Disposition: A | Discharge status: Home / Self Care | Attending: Emergency Medicine | Admitting: Emergency Medicine

## 2024-01-16 DIAGNOSIS — R109 Unspecified abdominal pain: Secondary | ICD-10-CM | POA: Diagnosis not present

## 2024-01-16 DIAGNOSIS — R110 Nausea: Secondary | ICD-10-CM | POA: Insufficient documentation

## 2024-01-16 DIAGNOSIS — T7840XA Allergy, unspecified, initial encounter: Secondary | ICD-10-CM | POA: Insufficient documentation

## 2024-01-16 MED ORDER — PREDNISONE 50 MG PO TABS: 50.0000 mg | ORAL_TABLET | Freq: Every day | ORAL | 0 refills | Status: AC

## 2024-01-16 MED ORDER — ONDANSETRON HCL 4 MG/2ML IJ SOLN
4.0000 mg | Freq: Once | INTRAMUSCULAR | Status: AC
  Administered 2024-01-16: 4 mg via INTRAVENOUS
  Filled 2024-01-16: qty 2

## 2024-01-16 MED ORDER — FAMOTIDINE IN NACL 20-0.9 MG/50ML-% IV SOLN
20.0000 mg | Freq: Once | INTRAVENOUS | Status: AC
  Administered 2024-01-16: 20 mg via INTRAVENOUS
  Filled 2024-01-16: qty 50

## 2024-01-16 MED ORDER — DIPHENHYDRAMINE HCL 50 MG/ML IJ SOLN
25.0000 mg | Freq: Once | INTRAMUSCULAR | Status: AC
  Administered 2024-01-16: 25 mg via INTRAVENOUS
  Filled 2024-01-16: qty 1

## 2024-01-16 MED ORDER — EPINEPHRINE 0.3 MG/0.3ML IJ SOAJ
0.3000 mg | Freq: Once | INTRAMUSCULAR | Status: DC
  Filled 2024-01-16: qty 0.3

## 2024-01-16 NOTE — ED Triage Notes (Signed)
 Pt to ED via POV c/o allergic reaction. Pt states he ate pizza 2-3hrs ago. Pt says his throat bothers him when he swallows, says it feels like its swollen. Pt able to speak in complete sentences and denies trouble breathing.

## 2024-01-16 NOTE — ED Provider Notes (Signed)
 [facility] Provider Note    Event Date/Time   First MD Initiated Contact with Patient 01/16/24 2106     (approximate)   History   Allergic Reaction   HPI  Damon Stevens is a 38 y.o. male past medical history significant for allergic reaction, who presents to the emergency department with concerns for an allergic reaction.  States that he has been having difficulties with eating certain foods and having allergic reaction.  States that he ate some pizza tonight and he is concerned that he might have an egg allergy and started having feelings like his throat was itching and closing.  Felt some nausea with some mild abdominal pain.  States that he took a steroid that he had at home with prednisone and some Benadryl .  Denies any shortness of breath.  States that he is had history of anaphylactic reactions in the past and has an EpiPen.  Has not yet followed up with an allergy specialist.     Physical Exam   Triage Vital Signs: ED Triage Vitals  Encounter Vitals Group     BP 01/16/24 2053 (!) 134/97     Girls Systolic BP Percentile --      Girls Diastolic BP Percentile --      Boys Systolic BP Percentile --      Boys Diastolic BP Percentile --      Pulse Rate 01/16/24 2053 86     Resp 01/16/24 2053 16     Temp 01/16/24 2053 97.9 F (36.6 C)     Temp Source 01/16/24 2053 Oral     SpO2 01/16/24 2053 98 %     Weight 01/16/24 2048 177 lb (80.3 kg)     Height 01/16/24 2048 6' (1.829 m)     Head Circumference --      Peak Flow --      Pain Score 01/16/24 2048 0     Pain Loc --      Pain Education --      Exclude from Growth Chart --     Most recent vital signs: Vitals:   01/16/24 2053 01/16/24 2215  BP: (!) 134/97   Pulse: 86 67  Resp: 16 18  Temp: 97.9 F (36.6 C)   SpO2: 98% 98%    Physical Exam Constitutional:      Appearance: He is well-developed.  HENT:     Head: Atraumatic.   Eyes:     Conjunctiva/sclera: Conjunctivae normal.     Cardiovascular:     Rate and Rhythm: Regular rhythm.  Pulmonary:     Effort: No respiratory distress.  Abdominal:     Tenderness: There is no abdominal tenderness.   Musculoskeletal:        General: Normal range of motion.     Cervical back: Normal range of motion.     Right lower leg: No edema.     Left lower leg: No edema.   Skin:    General: Skin is warm.     Capillary Refill: Capillary refill takes less than 2 seconds.   Neurological:     Mental Status: He is alert. Mental status is at baseline.     IMPRESSION / MDM / ASSESSMENT AND PLAN / ED COURSE  I reviewed the triage vital signs and the nursing notes.  Differential diagnosis including anaphylactic reaction, allergic reaction.  Have low suspicion for angioedema, no obvious swelling on my exam.  LABS (all labs ordered are listed, but only abnormal results  are displayed) Labs interpreted as -    Labs Reviewed - No data to display   MDM  Ordered epinephrine, Solu-Medrol , Benadryl  and famotidine .  Patient did not want to take the Solu-Medrol  because he states that this causes him to have extreme anxiety.  States that he took prednisone prior to arrival.  Also does not want the epinephrine.  Patient was monitored for 2 hours in the emergency department without progression of his symptoms.  States that he is feeling much better.  Speaking full sentences with no signs of respiratory distress.  Will discharge home with a prescription for steroids.  States that he has an EpiPen at his house.  Given information to follow-up with allergy clinic.  [facility]  Discussed return for any symptoms or signs of an allergic reaction or anaphylaxis.  Patient expressed understanding.     PROCEDURES:  Critical Care performed: No  Procedures  Patient's presentation is most consistent with acute presentation with potential threat to life or  bodily function.   MEDICATIONS ORDERED IN ED: Medications  EPINEPHrine (EPI-PEN) injection 0.3 mg (0 mg Intramuscular Hold 01/16/24 2202)  diphenhydrAMINE  (BENADRYL ) injection 25 mg (25 mg Intravenous Given 01/16/24 2205)  ondansetron (ZOFRAN) injection 4 mg (4 mg Intravenous Given 01/16/24 2205)  famotidine  (PEPCID ) IVPB 20 mg premix (0 mg Intravenous Stopped 01/16/24 2303)    FINAL CLINICAL IMPRESSION(S) / ED DIAGNOSES   Final diagnoses:  Allergic reaction, initial encounter     Rx / DC Orders   ED Discharge Orders          Ordered    predniSONE (DELTASONE) 50 MG tablet  Daily with breakfast        01/16/24 2329             Note:  This document was prepared using Dragon voice recognition software and may include unintentional dictation errors.   Suzanne Kirsch, MD 01/16/24 [phone]

## 2024-01-16 NOTE — ED Notes (Signed)
 Pt given warm blanket at this time

## 2024-01-20 ENCOUNTER — Emergency Department
Admission: EM | Admit: 2024-01-20 | Discharge: 2024-01-20 | Disposition: A | Discharge status: Home / Self Care | Attending: Emergency Medicine | Admitting: Emergency Medicine

## 2024-01-20 ENCOUNTER — Encounter: Payer: Self-pay | Admitting: Emergency Medicine

## 2024-01-20 ENCOUNTER — Other Ambulatory Visit: Payer: Self-pay

## 2024-01-20 DIAGNOSIS — R1310 Dysphagia, unspecified: Secondary | ICD-10-CM | POA: Insufficient documentation

## 2024-01-20 DIAGNOSIS — J029 Acute pharyngitis, unspecified: Secondary | ICD-10-CM | POA: Diagnosis present

## 2024-01-20 DIAGNOSIS — I10 Essential (primary) hypertension: Secondary | ICD-10-CM | POA: Diagnosis not present

## 2024-01-20 MED ORDER — FAMOTIDINE 20 MG PO TABS
40.0000 mg | ORAL_TABLET | Freq: Once | ORAL | Status: AC
  Administered 2024-01-20: 40 mg via ORAL
  Filled 2024-01-20: qty 2

## 2024-01-20 MED ORDER — FAMOTIDINE 20 MG PO TABS: 20.0000 mg | ORAL_TABLET | Freq: Two times a day (BID) | ORAL | 0 refills | Status: DC

## 2024-01-20 MED ORDER — ALUM & MAG HYDROXIDE-SIMETH 200-200-20 MG/5ML PO SUSP
30.0000 mL | Freq: Once | ORAL | Status: AC
  Administered 2024-01-20: 30 mL via ORAL
  Filled 2024-01-20: qty 30

## 2024-01-20 MED ORDER — ALUMINUM-MAGNESIUM-SIMETHICONE 200-200-20 MG/5ML PO SUSP: 30.0000 mL | Freq: Three times a day (TID) | ORAL | 0 refills | Status: DC

## 2024-01-20 NOTE — ED Provider Notes (Signed)
 [facility] Provider Note    Event Date/Time   First MD Initiated Contact with Patient 01/20/24 2038     (approximate)   History   Chief Complaint: Allergic Reaction   HPI  Damon Stevens is a 38 y.o. male with a history of hypertension heartburn who comes ED complaining of sore throat.  He is worried that is due to an egg allergy and possible egg exposure from something he ate.  He has an appointment with an allergist coming up in about 2 weeks.  Took some Benadryl  earlier today without relief.  No shortness of breath or rash.        Past Medical History:  Diagnosis Date   Cervical radiculopathy    Heartburn    Hypertension     Current Outpatient Rx   Order #: [id number] Class: Normal   Order #: [id number] Class: Normal   Order #: [id number] Class: Print   Order #: [id number] Class: Print   Order #: [id number] Class: Normal    History reviewed. No pertinent surgical history.  Physical Exam   Triage Vital Signs: ED Triage Vitals [01/20/24 2026]  Encounter Vitals Group     BP (!) 117/96     Girls Systolic BP Percentile      Girls Diastolic BP Percentile      Boys Systolic BP Percentile      Boys Diastolic BP Percentile      Pulse Rate (!) 106     Resp 18     Temp 97.9 F (36.6 C)     Temp Source Oral     SpO2 98 %     Weight 177 lb (80.3 kg)     Height 6' (1.829 m)     Head Circumference      Peak Flow      Pain Score 0     Pain Loc      Pain Education      Exclude from Growth Chart     Most recent vital signs: Vitals:   01/20/24 2026  BP: (!) 117/96  Pulse: (!) 106  Resp: 18  Temp: 97.9 F (36.6 C)  SpO2: 98%    General: Awake, no distress.  CV:  Good peripheral perfusion.  Regular rate and rhythm.  Normal distal pulses. Resp:  Normal effort.  Clear lungs Abd:  No distention.  Soft nontender Other:  No oral swelling   ED Results / Procedures / Treatments   Labs (all labs ordered are listed, but only abnormal  results are displayed) Labs Reviewed - No data to display   EKG    RADIOLOGY    PROCEDURES:  Procedures   MEDICATIONS ORDERED IN ED: Medications  famotidine  (PEPCID ) tablet 40 mg (40 mg Oral Given 01/20/24 2058)  alum & mag hydroxide-simeth (MAALOX/MYLANTA) 200-200-20 MG/5ML suspension 30 mL (30 mLs Oral Given 01/20/24 2154)     IMPRESSION / MDM / ASSESSMENT AND PLAN / ED COURSE  I reviewed the triage vital signs and the nursing notes.  DDx: GERD, localized allergic reaction.  No evidence of anaphylaxis.  Doubt ACS PE dissection  Patient's presentation is most consistent with exacerbation of chronic illness.  Patient presents with sore throat, most likely GERD versus mild allergic reaction.  Patient agrees to Pepcid .   Clinical Course as of 01/20/24 2209  Sat Jan 20, 2024  2149 Feeling better.  Tolerating oral intake.  Stable for discharge [PS]    Clinical Course User Index [PS] Viviann Pastor, MD  FINAL CLINICAL IMPRESSION(S) / ED DIAGNOSES   Final diagnoses:  Dysphagia, unspecified type     Rx / DC Orders   ED Discharge Orders          Ordered    famotidine  (PEPCID ) 20 MG tablet  2 times daily        01/20/24 2150    aluminum-magnesium hydroxide-simethicone  (MAALOX) 200-200-20 MG/5ML SUSP  3 times daily before meals & bedtime        01/20/24 2150             Note:  This document was prepared using Dragon voice recognition software and may include unintentional dictation errors.   Viviann Pastor, MD 01/20/24 2209

## 2024-01-20 NOTE — ED Triage Notes (Addendum)
 Pt arrived via POV with reports of allergic reaction pt not sure what he is allergic to, possibly food he ate, pt here recently for the same, did not use Epi Pen.  Took 50mg  Benadryl  30 mins PTA.   Pt c/o throat pain and swelling

## 2024-01-26 ENCOUNTER — Other Ambulatory Visit: Payer: Self-pay

## 2024-01-26 ENCOUNTER — Emergency Department
Admission: EM | Admit: 2024-01-26 | Discharge: 2024-01-26 | Disposition: A | Discharge status: Home / Self Care | Attending: Emergency Medicine | Admitting: Emergency Medicine

## 2024-01-26 ENCOUNTER — Encounter: Payer: Self-pay | Admitting: Intensive Care

## 2024-01-26 DIAGNOSIS — T7840XA Allergy, unspecified, initial encounter: Secondary | ICD-10-CM | POA: Insufficient documentation

## 2024-01-26 MED ORDER — FAMOTIDINE IN NACL 20-0.9 MG/50ML-% IV SOLN
20.0000 mg | Freq: Once | INTRAVENOUS | Status: AC
  Administered 2024-01-26: 20 mg via INTRAVENOUS
  Filled 2024-01-26: qty 50

## 2024-01-26 MED ORDER — METHYLPREDNISOLONE SODIUM SUCC 125 MG IJ SOLR
125.0000 mg | Freq: Once | INTRAMUSCULAR | Status: AC
  Administered 2024-01-26: 125 mg via INTRAVENOUS
  Filled 2024-01-26: qty 2

## 2024-01-26 NOTE — Discharge Instructions (Signed)
 Continue taking your famotidine  daily.  You may take Benadryl  as needed for itching.  Follow-up with your regular doctor.  Return to the ER immediately for new, worsening, or persistent lip or tongue swelling, difficulty swallowing, tightness in your throat, difficulty breathing, rash or hives, or any other new or worsening symptoms that concern you.

## 2024-01-26 NOTE — ED Notes (Signed)
 Pt states took 50mg  Benadryl  prior to coming into ED.  States has had multiple food allergies.  Is waiting to see Allergist.

## 2024-01-26 NOTE — ED Provider Notes (Signed)
 [facility] Provider Note    Event Date/Time   First MD Initiated Contact with Patient 01/26/24 1831     (approximate)   History   Allergic Reaction   HPI  Rice Walsh is a 38 y.o. male with a history of hypertension, cervical radiculopathy, and heartburn who presents with a concern for an allergic reaction.  The patient states that he was eating hamburgers.  He is not sure if there was may be eggs in the bread.  He started to have swelling of his lips, sensation in his throat like it was closing, but no shortness of breath.  He denies hives or itching to the rest of his body.  He states he took 50 mg of Benadryl  and the symptoms are starting to improve.  I reviewed the past medical records.  The patient has had frequent ED visits over the last month with concern for possible allergic reaction to eggs and other foods, on 6/28, 6/24, 6/20, 6/16, 6/5, and 5/13, the last 2 at [facility].   Physical Exam   Triage Vital Signs: ED Triage Vitals  Encounter Vitals Group     BP 01/26/24 1826 (!) 131/92     Girls Systolic BP Percentile --      Girls Diastolic BP Percentile --      Boys Systolic BP Percentile --      Boys Diastolic BP Percentile --      Pulse Rate 01/26/24 1822 99     Resp 01/26/24 1822 20     Temp 01/26/24 1826 98.4 F (36.9 C)     Temp Source 01/26/24 1826 Oral     SpO2 01/26/24 1822 98 %     Weight 01/26/24 1822 177 lb (80.3 kg)     Height 01/26/24 1822 6' (1.829 m)     Head Circumference --      Peak Flow --      Pain Score 01/26/24 1822 7     Pain Loc --      Pain Education --      Exclude from Growth Chart --     Most recent vital signs: Vitals:   01/26/24 1830 01/26/24 2200  BP: 136/88 (!) 126/91  Pulse: 91 73  Resp: 11 15  Temp:    SpO2: 100% 99%     General: Awake, no distress. CV:  Good peripheral perfusion.  Resp:  Normal effort.  Lungs CTAB. Abd:  No distention.  Other:  Slight lip swelling bilaterally.   Tongue appears normal.  Oropharynx is clear.  Normal voice.  No pooled secretions.   ED Results / Procedures / Treatments   Labs (all labs ordered are listed, but only abnormal results are displayed) Labs Reviewed - No data to display   EKG    RADIOLOGY    PROCEDURES:  Critical Care performed: No  Procedures   MEDICATIONS ORDERED IN ED: Medications  methylPREDNISolone  sodium succinate (SOLU-MEDROL ) 125 mg/2 mL injection 125 mg (125 mg Intravenous Given 01/26/24 1848)  famotidine  (PEPCID ) IVPB 20 mg premix (0 mg Intravenous Stopped 01/26/24 2007)     IMPRESSION / MDM / ASSESSMENT AND PLAN / ED COURSE  I reviewed the triage vital signs and the nursing notes.  38 year old male with PMH as noted above and frequent recent ED visits for possible allergic reactions to food presents with similar symptoms of an allergic reaction with lip swelling and discomfort in his throat but no urticaria.  Exam is unremarkable except for mild lip  swelling.    Differential diagnosis includes, but is not limited to, anaphylaxis, other allergic reaction, angioedema.  We will give famotidine  and Solu-Medrol  and observe.  There is no indication for epinephrine  at this time.  Patient's presentation is most consistent with acute complicated illness / injury requiring diagnostic workup.  The patient is on the cardiac monitor to evaluate for evidence of arrhythmia and/or significant heart rate changes.  ----------------------------------------- 9:59 PM on 01/26/2024 -----------------------------------------  On reassessment, the swelling has significantly improved if not completely resolved.  The patient states that he is feeling mostly better.  He is stable for discharge home at this time.  I instructed him to continue famotidine .  He has prednisone  at home and will take it for the next couple days.  He has an allergist appointment already scheduled for July 18.  Return precautions given, and he  expresses understanding.   FINAL CLINICAL IMPRESSION(S) / ED DIAGNOSES   Final diagnoses:  Allergic reaction, initial encounter     Rx / DC Orders   ED Discharge Orders     None        Note:  This document was prepared using Dragon voice recognition software and may include unintentional dictation errors.    Jacolyn Pae, MD 01/26/24 [phone]

## 2024-01-26 NOTE — ED Triage Notes (Signed)
 Patient presents with allergic reaction. Reports he ate two burgers at home and started having lip swelling, throat swelling, and headache. Reports some trouble swallowing   Reports he took 50mg  benadryl   Has allergy appointment set for the end of this month

## 2024-02-07 ENCOUNTER — Other Ambulatory Visit: Payer: Self-pay

## 2024-02-07 ENCOUNTER — Emergency Department

## 2024-02-07 DIAGNOSIS — R42 Dizziness and giddiness: Secondary | ICD-10-CM | POA: Diagnosis present

## 2024-02-07 DIAGNOSIS — R5383 Other fatigue: Secondary | ICD-10-CM | POA: Insufficient documentation

## 2024-02-07 DIAGNOSIS — Z5321 Procedure and treatment not carried out due to patient leaving prior to being seen by health care provider: Secondary | ICD-10-CM | POA: Insufficient documentation

## 2024-02-07 LAB — CBC WITH DIFFERENTIAL/PLATELET
Abs Immature Granulocytes: 0.01 K/uL (ref 0.00–0.07)
Basophils Absolute: 0.1 K/uL (ref 0.0–0.1)
Basophils Relative: 1 %
Eosinophils Absolute: 0.2 K/uL (ref 0.0–0.5)
Eosinophils Relative: 2 %
HCT: 42.2 % (ref 39.0–52.0)
Hemoglobin: 14.4 g/dL (ref 13.0–17.0)
Immature Granulocytes: 0 %
Lymphocytes Relative: 43 %
Lymphs Abs: 3.9 K/uL (ref 0.7–4.0)
MCH: 28.7 pg (ref 26.0–34.0)
MCHC: 34.1 g/dL (ref 30.0–36.0)
MCV: 84.2 fL (ref 80.0–100.0)
Monocytes Absolute: 0.6 K/uL (ref 0.1–1.0)
Monocytes Relative: 6 %
Neutro Abs: 4.5 K/uL (ref 1.7–7.7)
Neutrophils Relative %: 48 %
Platelets: 276 K/uL (ref 150–400)
RBC: 5.01 MIL/uL (ref 4.22–5.81)
RDW: 12.9 % (ref 11.5–15.5)
WBC: 9.3 K/uL (ref 4.0–10.5)
nRBC: 0 % (ref 0.0–0.2)

## 2024-02-07 LAB — COMPREHENSIVE METABOLIC PANEL WITH GFR
ALT: 25 U/L (ref 0–44)
AST: 24 U/L (ref 15–41)
Albumin: 4.4 g/dL (ref 3.5–5.0)
Alkaline Phosphatase: 51 U/L (ref 38–126)
Anion gap: 10 (ref 5–15)
BUN: 22 mg/dL — ABNORMAL HIGH (ref 6–20)
CO2: 25 mmol/L (ref 22–32)
Calcium: 8.8 mg/dL — ABNORMAL LOW (ref 8.9–10.3)
Chloride: 102 mmol/L (ref 98–111)
Creatinine, Ser: 1.14 mg/dL (ref 0.61–1.24)
GFR, Estimated: >60 mL/min (ref >60)
Glucose, Bld: 88 mg/dL (ref 70–99)
Potassium: 3.2 mmol/L — ABNORMAL LOW (ref 3.5–5.1)
Sodium: 137 mmol/L (ref 135–145)
Total Bilirubin: 0.8 mg/dL (ref 0.0–1.2)
Total Protein: 8.1 g/dL (ref 6.5–8.1)

## 2024-02-07 NOTE — ED Triage Notes (Signed)
 POV with CC of fatigue today with dizziness and lightheadedness. Reports recent increased allergies that are making meal planning difficult. Has appointment with allergist on Friday. A&Ox4. Ambulatory with steady gait. Fall band and shoes on pt.

## 2024-02-08 ENCOUNTER — Emergency Department: Admission: EM | Admit: 2024-02-08 | Discharge: 2024-02-08 | Discharge status: Against Medical Advice

## 2024-02-11 ENCOUNTER — Encounter: Payer: Self-pay | Admitting: Emergency Medicine

## 2024-02-11 ENCOUNTER — Other Ambulatory Visit: Payer: Self-pay

## 2024-02-11 ENCOUNTER — Emergency Department
Admission: EM | Admit: 2024-02-11 | Discharge: 2024-02-12 | Disposition: A | Discharge status: Home / Self Care | Attending: Emergency Medicine | Admitting: Emergency Medicine

## 2024-02-11 DIAGNOSIS — T7840XA Allergy, unspecified, initial encounter: Secondary | ICD-10-CM | POA: Insufficient documentation

## 2024-02-11 MED ORDER — PREDNISONE 20 MG PO TABS
40.0000 mg | ORAL_TABLET | ORAL | Status: DC
  Filled 2024-02-11: qty 2

## 2024-02-11 MED ORDER — FAMOTIDINE 20 MG PO TABS
20.0000 mg | ORAL_TABLET | Freq: Once | ORAL | Status: AC
  Administered 2024-02-11: 20 mg via ORAL
  Filled 2024-02-11: qty 1

## 2024-02-11 MED ORDER — DIPHENHYDRAMINE HCL 25 MG PO CAPS
25.0000 mg | ORAL_CAPSULE | Freq: Once | ORAL | Status: AC
  Administered 2024-02-11: 25 mg via ORAL
  Filled 2024-02-11: qty 1

## 2024-02-11 NOTE — ED Provider Notes (Signed)
 [facility] Provider Note    Event Date/Time   First MD Initiated Contact with Patient 02/11/24 2259     (approximate)   History   Allergic Reaction   HPI Damon Stevens is a 38 y.o. male with frequent visits to the emergency department for suspected allergic reactions.  This is his seventh visit in 6 months.  He said that he has EpiPen 's at home and he just completed some allergy testing in [place] but does not yet have the results.  He presents tonight because he was eating some Oreos cereal and started to feel like his throat was closing.  He said that it feels like he is choking.  He took no medications at home and did not use his EpiPen .  He said it feels like prior reactions.  He denies having a rash.  He is able to swallow and speak without difficulty and is breathing easily.     Physical Exam   Triage Vital Signs: ED Triage Vitals  Encounter Vitals Group     BP 02/11/24 2224 (!) 142/96     Girls Systolic BP Percentile --      Girls Diastolic BP Percentile --      Boys Systolic BP Percentile --      Boys Diastolic BP Percentile --      Pulse Rate 02/11/24 2224 89     Resp 02/11/24 2224 20     Temp 02/11/24 2224 98.2 F (36.8 C)     Temp src --      SpO2 02/11/24 2224 97 %     Weight 02/11/24 2225 79.4 kg (175 lb)     Height 02/11/24 2225 1.829 m (6')     Head Circumference --      Peak Flow --      Pain Score --      Pain Loc --      Pain Education --      Exclude from Growth Chart --     Most recent vital signs: Vitals:   02/11/24 2224  BP: (!) 142/96  Pulse: 89  Resp: 20  Temp: 98.2 F (36.8 C)  SpO2: 97%    General: Awake, no obvious distress, browsing the Internet on his phone in no apparent discomfort. HEENT: No facial rash.  No angioedema.  Clear oropharynx.  No stridor. CV:  Good peripheral perfusion.  Regular rate and rhythm. Resp:  Normal effort. Speaking easily and comfortably, no accessory muscle usage  nor intercostal retractions.  Lungs are clear to auscultation bilaterally. Abd:  No distention.  No tenderness to palpation. Other:  No urticaria nor other rash on visible skin surfaces.   ED Results / Procedures / Treatments   Labs (all labs ordered are listed, but only abnormal results are displayed) Labs Reviewed - No data to display   PROCEDURES:  Critical Care performed: No  Procedures    IMPRESSION / MDM / ASSESSMENT AND PLAN / ED COURSE  I reviewed the triage vital signs and the nursing notes.                              Differential diagnosis includes, but is not limited to, allergic reaction, anaphylaxis, anxiety, concern over possible medical condition.  Patient's presentation is most consistent with acute presentation with potential threat to life or bodily function.   Interventions/Medications given:  Medications  predniSONE  (DELTASONE ) tablet 40 mg (40 mg Oral Patient  Refused/Not Given 02/11/24 2343)  diphenhydrAMINE  (BENADRYL ) capsule 25 mg (25 mg Oral Given 02/11/24 2344)  famotidine  (PEPCID ) tablet 20 mg (20 mg Oral Given 02/11/24 2344)    (Note:  hospital course my include additional interventions and/or labs/studies not listed above.)   Patient appears very comfortable with normal and stable vital signs and no concerning physical exam findings.  Patient with subjective tightness of his throat.  Suspect anxiety may be playing a role.  I saw him approximately 1 hour and 20 minutes after his arrival to the ED and he is in no distress with no progression of his symptoms.  I provided reassurance and explained he does not meet criteria for anaphylaxis.  I suggested we try oral medications and we will observe him for a short period of time.  He is comfortable with this plan.  He is on the pulse oximeter.  I ordered medications as listed above.  I anticipate he will be able to follow-up with his regular doctor and/or allergy specialist.     Clinical Course as of  02/12/24 0155  Mon Feb 12, 2024  0155 I reassessed the patient.  He has been sleeping comfortably.  Satting 100%.  I woke him up and he said he feels a little bit better.  No evidence of anaphylaxis, appropriate for discharge and outpatient follow-up.  Patient agrees with the plan. [CF]    Clinical Course User Index [CF] Gordan Huxley, MD     FINAL CLINICAL IMPRESSION(S) / ED DIAGNOSES   Final diagnoses:  Allergic reaction, initial encounter     Rx / DC Orders   ED Discharge Orders          Ordered    predniSONE  (DELTASONE ) 20 MG tablet  Daily with breakfast        02/12/24 0114             Note:  This document was prepared using Dragon voice recognition software and may include unintentional dictation errors.   Gordan Huxley, MD 02/12/24 [phone]

## 2024-02-11 NOTE — ED Triage Notes (Signed)
 Arrived pov for allergic reaction  Per pt my throat is swollen it started when I was eating some cereal about an hour ago. It feels like I'm choking.  Has not taken any meds pta  Pt able to speak in full sentences, respirations even and unlabored

## 2024-02-12 MED ORDER — PREDNISONE 20 MG PO TABS: 40.0000 mg | ORAL_TABLET | Freq: Every day | ORAL | 0 refills | Status: AC

## 2024-02-12 NOTE — Discharge Instructions (Signed)
 Please continue to take a daily allergy pill such as cetirizine (Zyrtec) and any other medications you have been previously prescribed, as well as the short course of prednisone  prescribed tonight.  Follow-up with your allergist and/or your regular primary care provider.  Return to the emergency department if you develop new or worsening symptoms that concern you.

## 2024-03-28 ENCOUNTER — Ambulatory Visit (INDEPENDENT_AMBULATORY_CARE_PROVIDER_SITE_OTHER): Admitting: Allergy & Immunology

## 2024-03-28 ENCOUNTER — Encounter: Payer: Self-pay | Admitting: Allergy & Immunology

## 2024-03-28 ENCOUNTER — Other Ambulatory Visit: Payer: Self-pay

## 2024-03-28 VITALS — BP 118/80 | HR 97 | Temp 98.0°F | Resp 19 | Ht 70.87 in | Wt 171.1 lb

## 2024-03-28 DIAGNOSIS — R221 Localized swelling, mass and lump, neck: Secondary | ICD-10-CM | POA: Diagnosis not present

## 2024-03-28 DIAGNOSIS — T7840XD Allergy, unspecified, subsequent encounter: Secondary | ICD-10-CM | POA: Diagnosis not present

## 2024-03-28 DIAGNOSIS — T7840XA Allergy, unspecified, initial encounter: Secondary | ICD-10-CM

## 2024-03-28 DIAGNOSIS — R220 Localized swelling, mass and lump, head: Secondary | ICD-10-CM

## 2024-03-28 NOTE — Patient Instructions (Addendum)
 1. Allergic reaction with lip swelling.  - We are going to get some labs to follow up on your food allergies.  - Wheat IgE - Soybean IgE - Orange IgE - Allergen, Oyster, f290 - Egg Component Panel - Allergen Egg Yolk f75 - Milk Component Panel - IgE Nut Prof. w/Component Rflx - Allergy Panel 19, Seafood Group - Allergen Profile, Shellfish - Allergen Sesame f10 - Alpha-Gal Panel  We are also getting some additional labs to look for weird causes of allergic reaction, including mast cell disease. - This includes a lot of urine studies to look for the metabolites (by products) of mast cell activation. - Consider Xolair to help with preventing these episodes (this can stabilize your mast cells and keep them from releasing all of their chemicals).  - Start Zyrtec (cetirizine) 10mg  twice daily to try to suppress these episodes.   2. Return in about 6 weeks (around 05/09/2024). You can have the follow up appointment with Dr. Iva or a Nurse Practicioner (our Nurse Practitioners are excellent and always have Physician oversight!).    Please inform us  of any Emergency Department visits, hospitalizations, or changes in symptoms. Call us  before going to the ED for breathing or allergy symptoms since we might be able to fit you in for a sick visit. Feel free to contact us  anytime with any questions, problems, or concerns.  It was a pleasure to meet you today!  Websites that have reliable patient information: 1. American Academy of Asthma, Allergy, and Immunology: www.aaaai.org 2. Food Allergy Research and Education (FARE): foodallergy.org 3. Mothers of Asthmatics: http://www.asthmacommunitynetwork.org 4. American College of Allergy, Asthma, and Immunology: www.acaai.org      "Like" us  on Facebook and Instagram for our latest updates!      A healthy democracy works best when [employer] participate! Make sure you are registered to vote! If you have moved or changed any of your contact  information, you will need to get this updated before voting! Scan the QR codes below to learn more!

## 2024-03-28 NOTE — Progress Notes (Signed)
 NEW PATIENT  Date of Service/Encounter:  03/28/24  Consult requested by: Center, [facility]   Assessment:   Allergic reaction - unknown trigger  Swelling of lip, tongue, and throat   A diagnosis of mast cell activation syndrome has been proposed to diagnose patients with a variety of symptoms and laboratory studies possibly suggestive of mast cell activation without meeting full criteria for diagnosing systemic mastocytosis. This diagnosis remains somewhat controversial with the proposed diagnostic criteria continuing to evolve, but at present it may be reasonable to consider this diagnosis if the serum tryptase is found to be significantly elevated when the patient experiences a flare of symptoms, and then falls back to the patient's baseline level when symptoms recede to baseline. Included in the diagnostic criteria at present is also the existence of certain clinical symptoms which are significantly relieved with anti-mast cell mediator therapy (i.e. antihistamines - H1 H2 blockers, montelukast, etc.) Thus, if mast cell involvement remains in the differential diagnosis, it is recommended to obtain serial serum tryptase blood testing during flares and correlate them with patient symptoms.   We are going to test other mediators of mast cell activation, including plasma and urinary metabolites (prostaglandins PGD2, PGF2alpha, etc.). These have been also proposed to be elevated with MCAS, however there are issues with these tests that potentially confound the interpretation of the results and make these tests less reliable. Histamine levels are confounded by dietary ingestion of histamine-containing foods. In addition, histamine is not necessarily a reliable marker of histamine release and mast cell activation if a histamine-free diet has not been carefully followed beforehand.  In addition, the regular use of oral antihistamines might result in alterations of the levels.  Cessation of  antihistamines before collecting these has been proposed, but oftentimes patients cannot tolerate being off of antihistamines due to their symptoms, leading to a risk versus benefit conundrum.   In addition, there are other sources of prostaglandin metabolites.The prostaglandins PGD2 and PGF2alpha - metabolites from PGH2-synthase/COX-1/2 activation and PGD2 synthase - can be synthesized by hematopoietic cells (such as histiocytes, Langerhans cells, dendritic cells, Th2 lymphocytes, as well as mast cells) and also non-hematopoietic cells (such as neurons and oligodendrocytes in the central nervous system, epithelial cells of the choroid plexus, Leydig cells in male genital organs, and endocardial and myocardial cells, amongst others). Therefore the detection of PGD2 or its metabolites is somewhat problematic in the evaluation of mast cell activation as numerous cell types can generate PGD2, in addition to mast cells. In fact, the detection of elevated PGD2 in the setting of a normal tryptase can implicate a non-mast cell source of PGD2, and thus does not necessarily add additional clarity when compared to the more mast cell-specific protein tryptase. Therefore, elevation of tryptase is the most specific test for MCAS.   We will order a standing order in order to check the tryptase serially within 4-6 hours of the onset of the patient's own flaring of symptoms.  This will help to correlate any raises of tryptase with symptoms, triggers, and timing. This can be drawn at any lab or even an Emergency Room.  In the meantime, we are going to attempt to manage symptoms with a combination of histamine blockers and other medications to help alleviate this constellation of symptoms. Depending on the severity of the symptoms, we may even consider advancing to Xolair (omalizumab), which has been shown to decrease the incidence of mast cell related symptoms, including anaphylaxis. In addition, if you have noticed that  there  is a particular trigger for your symptoms, we would recommend avoiding that trigger if possible.    Plan/Recommendations:   1. Allergic reaction with lip swelling.  - We are going to get some labs to follow up on your food allergies.  - Wheat IgE - Soybean IgE - Orange IgE - Allergen, Oyster, f290 - Egg Component Panel - Allergen Egg Yolk f75 - Milk Component Panel - IgE Nut Prof. w/Component Rflx - Allergy  Panel 19, Seafood Group - Allergen Profile, Shellfish - Allergen Sesame f10 - Alpha-Gal Panel  We are also getting some additional labs to look for weird causes of allergic reaction, including mast cell disease. - This includes a lot of urine studies to look for the metabolites (by products) of mast cell activation. - Consider Xolair to help with preventing these episodes (this can stabilize your mast cells and keep them from releasing all of their chemicals).  - Start Zyrtec (cetirizine) 10mg  twice daily to try to suppress these episodes.   2. Return in about 6 weeks (around 05/09/2024). You can have the follow up appointment with Dr. Iva or a Nurse Practicioner (our Nurse Practitioners are excellent and always have Physician oversight!).    This note in its entirety was forwarded to the Provider who requested this consultation.  Subjective:   Damon Stevens is a 38 y.o. male presenting today for evaluation of  Chief Complaint  Patient presents with   Allergy  Testing    Damon Stevens has a history of the following: There are no active problems to display for this patient.   History obtained from: chart review and patient.  Discussed the use of AI scribe software for clinical note transcription with the patient and/or guardian, who gave verbal consent to proceed.  [name] was referred by Center, [facility].     Damon Stevens is a 38 y.o. male presenting for an evaluation of allergic reactions.  Damon Stevens is a 38 year  old male with multiple food allergies who presents with allergic reactions. He has a referral from the [facility] for further evaluation and management of his allergies.  He experiences allergic reactions characterized by throat swelling, rash, and lip swelling, which have required multiple hospital visits. During these visits, he received Benadryl  injections or used an EpiPen , both of which provided relief. To avoid allergens, he has restricted his diet to chicken, potatoes, carrots, and Gatorade.  He is allergic to wheat, soy, shellfish (including shrimp, crab, and oyster), orange, egg whites, egg yolk, and dairy. Exposure to dairy results in gastrointestinal symptoms such as vomiting and diarrhea. He has lost approximately 30 pounds due to dietary restrictions.  He has a history of severe allergic reactions to foods containing wheat and soy, such as Oreos cereal, and to tree nuts, which required Benadryl . He avoids all forms of egg and meticulously reads labels to avoid allergens.  He did see [facility] in July 2025.  He had testing to the entire panel which was completely negative.  I asked him why he was not seeing them any longer and he said that they were providing the care he needed and he threatened to sue them.  Did not go into any more detail.  His father is allergic to shellfish.   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 No new medications.     Allergic Rhinitis Symptom History: He is not currently taking any daily antihistamines like Zyrtec and  has not started any new medications recently.  He did see [facility] in July 2025.  He had testing that was positive to tree mix, [place] grass, Timothy grass, weed mix, cockroach, cat, and dust mite.  Otherwise it was completely negative.  Otherwise, there is no history of other atopic diseases, including drug allergies, stinging insect allergies, or contact dermatitis.  There is no significant infectious history. Vaccinations are up to date.    Past Medical History: There are no active problems to display for this patient.   Medication List:  Allergies as of 03/28/2024       Reactions   Egg-derived Products Anaphylaxis   Other Reaction(s): Facial swelling   Orange Oil Anaphylaxis   Other Reaction(s): Anaphylaxis, Lip swelling   Shellfish Allergy  Hypertension, Shortness Of Breath, Other (See Comments), Hives   Other reaction(s): Hypertension   Shrimp Extract Swelling   Ketorolac Tromethamine Hypertension, Palpitations        Medication List        Accurate as of March 28, 2024 11:59 PM. If you have any questions, ask your nurse or doctor.          albuterol  108 (90 Base) MCG/ACT inhaler Commonly known as: VENTOLIN  HFA Inhale 2 puffs into the lungs every 6 (six) hours as needed for wheezing or shortness of breath.   aluminum -magnesium  hydroxide-simethicone  200-200-20 MG/5ML Susp Commonly known as: MAALOX Take 30 mLs by mouth 4 (four) times daily -  before meals and at bedtime.   chlorpheniramine-HYDROcodone 10-8 MG/5ML Commonly known as: TUSSIONEX Take 5 mLs by mouth every 12 (twelve) hours as needed for cough.   famotidine  20 MG tablet Commonly known as: Pepcid  Take 1 tablet (20 mg total) by mouth 2 (two) times daily.   famotidine  20 MG tablet Commonly known as: PEPCID  Take 1 tablet (20 mg total) by mouth 2 (two) times daily.        Birth History: non-contributory  Developmental History: non-contributory  Past Surgical History: Past Surgical History:  Procedure Laterality Date   ADENOIDECTOMY       Family History: History reviewed. No pertinent family history.   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   Review of systems otherwise negative other than that mentioned in the HPI.    Objective:   Blood pressure  118/80, pulse 97, temperature 98 F (36.7 C), temperature source Temporal, resp. rate 19, height 5' 10.87 (1.8 m), weight 171 lb 1.6 oz (77.6 kg), SpO2 97%. Body mass index is 23.95 kg/m.     Physical Exam Vitals reviewed.  Constitutional:      Appearance: He is well-developed.     Comments: Slightly anxious.  HENT:     Head: Normocephalic and atraumatic.     Right Ear: Tympanic membrane, ear canal and external ear normal. No drainage, swelling or tenderness. Tympanic membrane is not injected, scarred, erythematous, retracted or bulging.     Left Ear: Tympanic membrane, ear canal and external ear normal. No drainage, swelling or tenderness. Tympanic membrane is not injected, scarred, erythematous, retracted or bulging.     Nose: No nasal deformity, septal deviation, mucosal edema or rhinorrhea.     Right Sinus: No maxillary sinus tenderness or frontal sinus tenderness.     Left Sinus: No maxillary sinus tenderness or frontal sinus tenderness.     Mouth/Throat:     Mouth: Mucous membranes are not pale and not dry.  Pharynx: Uvula midline.  Eyes:     General:        Right eye: No discharge.        Left eye: No discharge.     Conjunctiva/sclera: Conjunctivae normal.     Right eye: Right conjunctiva is not injected. No chemosis.    Left eye: Left conjunctiva is not injected. No chemosis.    Pupils: Pupils are equal, round, and reactive to light.  Cardiovascular:     Rate and Rhythm: Normal rate and regular rhythm.     Heart sounds: Normal heart sounds.  Pulmonary:     Effort: Pulmonary effort is normal. No tachypnea, accessory muscle usage or respiratory distress.     Breath sounds: Normal breath sounds. No wheezing, rhonchi or rales.  Chest:     Chest wall: No tenderness.  Abdominal:     Tenderness: There is no abdominal tenderness. There is no guarding or rebound.  Lymphadenopathy:     Head:     Right side of head: No submandibular, tonsillar or occipital adenopathy.      Left side of head: No submandibular, tonsillar or occipital adenopathy.     Cervical: No cervical adenopathy.  Skin:    Coloration: Skin is not pale.     Findings: No abrasion, erythema, petechiae or rash. Rash is not papular, urticarial or vesicular.  Neurological:     Mental Status: He is alert.  Psychiatric:        Behavior: Behavior is cooperative.      Diagnostic studies: labs sent instead          Marty Shaggy, MD [facility] 

## 2024-03-31 LAB — IGE NUT PROF. W/COMPONENT RFLX

## 2024-04-02 LAB — C3 AND C4
Complement C3, Serum: 145 mg/dL (ref 82–167)
Complement C4, Serum: 33 mg/dL (ref 12–38)

## 2024-04-02 LAB — C1 ESTERASE INHIBITOR: C1INH SerPl-mCnc: 36 mg/dL (ref 21–39)

## 2024-04-02 LAB — COMPLEMENT COMPONENT C1Q: Complement C1Q: 12 mg/dL (ref 10.2–20.3)

## 2024-04-02 LAB — C1 ESTERASE INHIBITOR, FUNCTIONAL: C1INH Functional/C1INH Total MFr SerPl: >105 %{normal}

## 2024-04-07 ENCOUNTER — Ambulatory Visit: Payer: Self-pay | Admitting: Allergy & Immunology

## 2024-04-09 NOTE — Telephone Encounter (Signed)
 Pt informed of results. Mailed a copy per request. He would like to schedule a telehealth visit with Arlean. I will have the front desk call him back to schedule.

## 2024-04-11 LAB — IGE NUT PROF. W/COMPONENT RFLX
F017-IgE Hazelnut (Filbert): 1.11 kU/L — AB
F018-IgE Brazil Nut: 0.21 kU/L — AB
F020-IgE Almond: 1.47 kU/L — AB
F202-IgE Cashew Nut: 0.64 kU/L — AB
F203-IgE Pistachio Nut: 1.96 kU/L — AB
F256-IgE Walnut: 1.29 kU/L — AB
Macadamia Nut, IgE: 1.44 kU/L — AB
Peanut, IgE: 1.79 kU/L — AB
Pecan Nut IgE: 0.41 kU/L — AB

## 2024-04-11 LAB — PEANUT COMPONENTS
F352-IgE Ara h 8: <0.1 kU/L
F422-IgE Ara h 1: <0.1 kU/L
F423-IgE Ara h 2: <0.1 kU/L
F424-IgE Ara h 3: <0.1 kU/L
F427-IgE Ara h 9: <0.1 kU/L
F447-IgE Ara h 6: <0.1 kU/L

## 2024-04-11 LAB — MILK COMPONENT PANEL
F076-IgE Alpha Lactalbumin: 0.12 kU/L — AB
F077-IgE Beta Lactoglobulin: 0.1 kU/L — AB
F078-IgE Casein: <0.1 kU/L

## 2024-04-11 LAB — ALLERGY PANEL 19, SEAFOOD GROUP
Allergen Salmon IgE: <0.1 kU/L
Catfish: <0.1 kU/L
Codfish IgE: <0.1 kU/L
F023-IgE Crab: 1.34 kU/L — AB
F080-IgE Lobster: 0.71 kU/L — AB
Shrimp IgE: 0.73 kU/L — AB
Tuna: <0.1 kU/L

## 2024-04-11 LAB — ALPHA-GAL PANEL
Allergen Lamb IgE: <0.1 kU/L
Beef IgE: 0.12 kU/L — AB
IgE (Immunoglobulin E), Serum: 340 [IU]/mL (ref 6–495)
O215-IgE Alpha-Gal: <0.1 kU/L
Pork IgE: <0.1 kU/L

## 2024-04-11 LAB — TRYPTASE: Tryptase: 4.9 ug/L (ref 2.2–13.2)

## 2024-04-11 LAB — PANEL 604726
Cor A 1 IgE: <0.1 kU/L
Cor A 14 IgE: <0.1 kU/L
Cor A 8 IgE: <0.1 kU/L
Cor A 9 IgE: 0.38 kU/L — AB

## 2024-04-11 LAB — PANEL 604721
Jug R 1 IgE: 0.12 kU/L — AB
Jug R 3 IgE: <0.1 kU/L

## 2024-04-11 LAB — ALLERGEN, ORANGE F33: Orange: 1.22 kU/L — AB

## 2024-04-11 LAB — PROSTAGLANDIN D2, SERUM: Prostaglandin D2, serum: 151 pg/mL

## 2024-04-11 LAB — ALLERGEN SESAME F10: Sesame Seed IgE: 1.85 kU/L — AB

## 2024-04-11 LAB — ALLERGEN PROFILE, SHELLFISH
Clam IgE: 0.76 kU/L — AB
F290-IgE Oyster: 1.24 kU/L — AB
Scallop IgE: 1.24 kU/L — AB

## 2024-04-11 LAB — PANEL 604239: ANA O 3 IgE: <0.1 kU/L

## 2024-04-11 LAB — ALLERGEN COMPONENT COMMENTS

## 2024-04-11 LAB — EGG COMPONENT PANEL
F232-IgE Ovalbumin: <0.1 kU/L
F233-IgE Ovomucoid: 0.13 kU/L — AB

## 2024-04-11 LAB — PANEL 604350: Ber E 1 IgE: <0.1 kU/L

## 2024-04-11 LAB — ALLERGEN, WHEAT, F4: Wheat IgE: 1.46 kU/L — AB

## 2024-04-11 LAB — ALLERGEN SOYBEAN: Soybean IgE: 1.43 kU/L — AB

## 2024-04-11 LAB — ALLERGEN EGG YOLK F75: IgE Egg (Yolk): <0.1 kU/L

## 2024-04-23 ENCOUNTER — Other Ambulatory Visit: Payer: Self-pay

## 2024-04-23 ENCOUNTER — Emergency Department
Admission: EM | Admit: 2024-04-23 | Discharge: 2024-04-23 | Disposition: A | Discharge status: Home / Self Care | Attending: Emergency Medicine | Admitting: Emergency Medicine

## 2024-04-23 DIAGNOSIS — R1013 Epigastric pain: Secondary | ICD-10-CM | POA: Diagnosis not present

## 2024-04-23 DIAGNOSIS — R42 Dizziness and giddiness: Secondary | ICD-10-CM | POA: Insufficient documentation

## 2024-04-23 LAB — COMPREHENSIVE METABOLIC PANEL WITH GFR
ALT: 28 U/L (ref 0–44)
AST: 25 U/L (ref 15–41)
Albumin: 4.4 g/dL (ref 3.5–5.0)
Alkaline Phosphatase: 75 U/L (ref 38–126)
Anion gap: 10 (ref 5–15)
BUN: 18 mg/dL (ref 6–20)
CO2: 25 mmol/L (ref 22–32)
Calcium: 9.2 mg/dL (ref 8.9–10.3)
Chloride: 103 mmol/L (ref 98–111)
Creatinine, Ser: 1.18 mg/dL (ref 0.61–1.24)
GFR, Estimated: >60 mL/min (ref >60)
Glucose, Bld: 89 mg/dL (ref 70–99)
Potassium: 3.7 mmol/L (ref 3.5–5.1)
Sodium: 138 mmol/L (ref 135–145)
Total Bilirubin: 0.8 mg/dL (ref 0.0–1.2)
Total Protein: 7.7 g/dL (ref 6.5–8.1)

## 2024-04-23 LAB — URINALYSIS, ROUTINE W REFLEX MICROSCOPIC
Bilirubin Urine: NEGATIVE
Glucose, UA: NEGATIVE mg/dL
Hgb urine dipstick: NEGATIVE
Ketones, ur: NEGATIVE mg/dL
Leukocytes,Ua: NEGATIVE
Nitrite: NEGATIVE
Protein, ur: NEGATIVE mg/dL
Specific Gravity, Urine: 1.005 (ref 1.005–1.030)
pH: 6 (ref 5.0–8.0)

## 2024-04-23 LAB — CBC
HCT: 41 % (ref 39.0–52.0)
Hemoglobin: 14.1 g/dL (ref 13.0–17.0)
MCH: 29.5 pg (ref 26.0–34.0)
MCHC: 34.4 g/dL (ref 30.0–36.0)
MCV: 85.8 fL (ref 80.0–100.0)
Platelets: 228 K/uL (ref 150–400)
RBC: 4.78 MIL/uL (ref 4.22–5.81)
RDW: 12.5 % (ref 11.5–15.5)
WBC: 5.6 K/uL (ref 4.0–10.5)
nRBC: 0 % (ref 0.0–0.2)

## 2024-04-23 LAB — LIPASE, BLOOD: Lipase: 24 U/L (ref 11–51)

## 2024-04-23 MED ORDER — ALUM & MAG HYDROXIDE-SIMETH 200-200-20 MG/5ML PO SUSP
30.0000 mL | Freq: Once | ORAL | Status: AC
  Administered 2024-04-23: 30 mL via ORAL
  Filled 2024-04-23: qty 30

## 2024-04-23 MED ORDER — ACETAMINOPHEN 325 MG PO TABS
650.0000 mg | ORAL_TABLET | Freq: Once | ORAL | Status: AC
  Administered 2024-04-23: 650 mg via ORAL
  Filled 2024-04-23: qty 2

## 2024-04-23 MED ORDER — SODIUM CHLORIDE 0.9 % IV BOLUS
1000.0000 mL | Freq: Once | INTRAVENOUS | Status: AC
  Administered 2024-04-23: 1000 mL via INTRAVENOUS

## 2024-04-23 NOTE — ED Provider Notes (Signed)
 [facility] Provider Note    Event Date/Time   First MD Initiated Contact with Patient 04/23/24 1633     (approximate)   History   Dizziness and Near Syncope   HPI  Damon Stevens is a 38 year old male presenting to the emergency department for evaluation of lightheadedness, abdominal pain.  Reports that a few hours ago he had onset of dizziness described as lightheadedness with associated abdominal pain for the past few hours.  Reports that he has been having some allergy  symptoms in the past several months and has had some decreased p.o. intake in the setting of this.  Reports his abdominal pain is primarily in his epigastric area.  No nausea, vomiting, diarrhea, blood in stool.  I did review recent ER visit in the [facility] ER.  At that time patient presented with dizziness, chest pain, upper abdominal pain at that time he had reassuring labs, EKG.  It felt improved after GI cocktail and fluids.  Had suspected gastritis and was prescribed omeprazole.  Patient tells me that he has not reliably been taking this.        Physical Exam   Triage Vital Signs: ED Triage Vitals  Encounter Vitals Group     BP 04/23/24 1640 132/86     Girls Systolic BP Percentile --      Girls Diastolic BP Percentile --      Boys Systolic BP Percentile --      Boys Diastolic BP Percentile --      Pulse Rate 04/23/24 1640 71     Resp 04/23/24 1640 18     Temp 04/23/24 1640 98 F (36.7 C)     Temp Source 04/23/24 1640 Oral     SpO2 04/23/24 1640 98 %     Weight 04/23/24 1455 173 lb (78.5 kg)     Height 04/23/24 1455 6' (1.829 m)     Head Circumference --      Peak Flow --      Pain Score 04/23/24 1454 6     Pain Loc --      Pain Education --      Exclude from Growth Chart --     Most recent vital signs: Vitals:   04/23/24 1640  BP: 132/86  Pulse: 71  Resp: 18  Temp: 98 F (36.7 C)  SpO2: 98%     General: Awake, interactive  CV:  Regular rate, good  peripheral perfusion.  Resp:  Unlabored respirations.  Abd:  Nondistended, soft, mild tenderness in epigastric region, remainder of abdomen nontender Neuro:  Symmetric facial movement, fluid speech   ED Results / Procedures / Treatments   Labs (all labs ordered are listed, but only abnormal results are displayed) Labs Reviewed  URINALYSIS, ROUTINE W REFLEX MICROSCOPIC - Abnormal; Notable for the following components:      Result Value   Color, Urine COLORLESS (*)    APPearance CLEAR (*)    All other components within normal limits  COMPREHENSIVE METABOLIC PANEL WITH GFR  CBC  LIPASE, BLOOD  CBG MONITORING, ED     EKG EKG independently reviewed and interpreted by myself demonstrates:  EKG demonstrates normal sinus rhythm at a rate of 80, PR 126, QRS 88, QTc 360, no acute ST changes  RADIOLOGY Imaging independently reviewed and interpreted by myself demonstrates:   Formal Radiology Read:  No results found.  PROCEDURES:  Critical Care performed: No  Procedures   MEDICATIONS ORDERED IN ED: Medications  sodium chloride  0.9 %  bolus 1,000 mL (1,000 mLs Intravenous New Bag/Given 04/23/24 1756)  acetaminophen (TYLENOL) tablet 650 mg (650 mg Oral Given 04/23/24 1746)  alum & mag hydroxide-simeth (MAALOX/MYLANTA) 200-200-20 MG/5ML suspension 30 mL (30 mLs Oral Given 04/23/24 1745)     IMPRESSION / MDM / ASSESSMENT AND PLAN / ED COURSE  I reviewed the triage vital signs and the nursing notes.  Differential diagnosis includes, but is not limited to, dehydration, anemia, electrolyte abnormality, arrhythmia, gastritis, low suspicion other acute intra-abdominal process given overall reassuring abdominal exam  Patient's presentation is most consistent with acute presentation with potential threat to life or bodily function.  38 year old male presenting to the emergency department for evaluation of dizziness and abdominal pain.  Stable vitals on presentation.  Labs with reassuring  CBC, CMP, UA without evidence of infection.  Lipase significantly delayed due to lab malfunction, but overall low clinical suspicion for pancreatitis.  EKG without acute ischemic findings.  Patient was treated symptomatically with IV fluids, GI cocktail, Tylenol.  On reevaluation he does report significant improved symptoms.  No further reported dizziness.  Overall low suspicion emergent process.  Did discuss supportive care measures and importance of taking his PPI regularly for its benefits.  Strict return precautions provided.  Patient discharged in stable condition.      FINAL CLINICAL IMPRESSION(S) / ED DIAGNOSES   Final diagnoses:  Lightheadedness  Epigastric pain     Rx / DC Orders   ED Discharge Orders     None        Note:  This document was prepared using Dragon voice recognition software and may include unintentional dictation errors.   Levander Slate, MD 04/23/24 [phone]

## 2024-04-23 NOTE — Discharge Instructions (Signed)
 You are seen in the ER today for evaluation of your dizziness and abdominal pain.  Your testing was fortunately reassuring against an emergency cause for this.  Please take your medications as directed.  Follow-up with your primary care doctor for further evaluation.  Return to the ER for new or worsening symptoms.

## 2024-04-23 NOTE — ED Triage Notes (Signed)
 Pt comes in via pov with complaints of dizziness and abdominal pain that started about 2-3 hours ago. Pt states that when he has been having gray bowel movements. Pt states that he feels like he is going to pass out from weakness. Pt complains of abdominal pain 6/10. Pt is alert and oriented z4 with no signs of acute distress at this time.

## 2024-04-23 NOTE — ED Notes (Signed)
 See triage note Presents with some dizziness and felt like his legs were going to give out   Then had some abd pain Afebrile on arrival

## 2024-04-28 ENCOUNTER — Emergency Department
Admission: EM | Admit: 2024-04-28 | Discharge: 2024-04-28 | Disposition: A | Discharge status: Home / Self Care | Attending: Emergency Medicine | Admitting: Emergency Medicine

## 2024-04-28 ENCOUNTER — Other Ambulatory Visit: Payer: Self-pay

## 2024-04-28 DIAGNOSIS — J029 Acute pharyngitis, unspecified: Secondary | ICD-10-CM | POA: Insufficient documentation

## 2024-04-28 LAB — GROUP A STREP BY PCR: Group A Strep by PCR: NOT DETECTED

## 2024-04-28 NOTE — ED Provider Notes (Signed)
   [facility] Provider Note    Event Date/Time   First MD Initiated Contact with Patient 04/28/24 1700     (approximate)   History   Sore throat   HPI  Damon Stevens is a 38 y.o. male who presents with complaints of sore throat.  He reports this developed last night.  Denies difficulty swallowing.  No fevers or chills.  Review of medical record demonstrates multiple ED visits for various complaints often relating to allergic reactions.     Physical Exam   Triage Vital Signs: ED Triage Vitals  Encounter Vitals Group     BP 04/28/24 1640 121/78     Girls Systolic BP Percentile --      Girls Diastolic BP Percentile --      Boys Systolic BP Percentile --      Boys Diastolic BP Percentile --      Pulse Rate 04/28/24 1640 78     Resp 04/28/24 1640 15     Temp 04/28/24 1640 99 F (37.2 C)     Temp Source 04/28/24 1640 Oral     SpO2 04/28/24 1640 99 %     Weight 04/28/24 1642 77.1 kg (170 lb)     Height 04/28/24 1642 1.829 m (6')     Head Circumference --      Peak Flow --      Pain Score 04/28/24 1642 9     Pain Loc --      Pain Education --      Exclude from Growth Chart --     Most recent vital signs: Vitals:   04/28/24 1640  BP: 121/78  Pulse: 78  Resp: 15  Temp: 99 F (37.2 C)  SpO2: 99%     General: Awake, no distress.  CV:  Good peripheral perfusion.  Resp:  Normal effort.  Abd:  No distention.  Other:  Pharynx is normal, no significant lymphadenopathy palpated.  No difficulty swallowing.  No rash.   ED Results / Procedures / Treatments   Labs (all labs ordered are listed, but only abnormal results are displayed) Labs Reviewed  GROUP A STREP BY PCR     EKG     RADIOLOGY     PROCEDURES:  Critical Care performed:   Procedures   MEDICATIONS ORDERED IN ED: Medications - No data to display   IMPRESSION / MDM / ASSESSMENT AND PLAN / ED COURSE  I reviewed the triage vital signs and the nursing  notes. Patient's presentation is most consistent with acute, uncomplicated illness.  Patient presents with likely early pharyngitis, strep swab is negative, likely viral in nature, reassurance provided, recommend supportive care outpatient follow-up with PCP, no indication for imaging at this time.  Return precautions discussed        FINAL CLINICAL IMPRESSION(S) / ED DIAGNOSES   Final diagnoses:  Pharyngitis, unspecified etiology     Rx / DC Orders   ED Discharge Orders     None        Note:  This document was prepared using Dragon voice recognition software and may include unintentional dictation errors.   Arlander Charleston, MD 04/28/24 [phone]

## 2024-04-28 NOTE — ED Notes (Signed)
 ED Provider at bedside.

## 2024-04-28 NOTE — ED Triage Notes (Signed)
 Pt reports onset of intermittent blurred vision 1 hour ago. Pt says right now when he looks to the right it is blurry. Pt reports submandibular jaw pain with palpation on the right side that started yesterday. Pt took 1 amoxicillin at 1130 today because he thought it was his tonsils, but reports no improvement. Pt endorses some postnasal drip and pain when opening his mouth. Pt also says when he closes his eyes his throat and jaw hurt worse.

## 2024-05-09 ENCOUNTER — Encounter: Payer: Self-pay | Admitting: Allergy & Immunology

## 2024-05-09 ENCOUNTER — Ambulatory Visit: Admitting: Allergy & Immunology

## 2024-05-09 ENCOUNTER — Other Ambulatory Visit: Payer: Self-pay

## 2024-05-09 VITALS — BP 120/80 | HR 77 | Temp 97.4°F | Resp 18 | Ht 70.08 in | Wt 163.4 lb

## 2024-05-09 DIAGNOSIS — R1084 Generalized abdominal pain: Secondary | ICD-10-CM | POA: Diagnosis not present

## 2024-05-09 DIAGNOSIS — T7800XA Anaphylactic reaction due to unspecified food, initial encounter: Secondary | ICD-10-CM

## 2024-05-09 DIAGNOSIS — R634 Abnormal weight loss: Secondary | ICD-10-CM

## 2024-05-09 DIAGNOSIS — R220 Localized swelling, mass and lump, head: Secondary | ICD-10-CM | POA: Diagnosis not present

## 2024-05-09 DIAGNOSIS — R221 Localized swelling, mass and lump, neck: Secondary | ICD-10-CM

## 2024-05-09 MED ORDER — EPINEPHRINE 0.3 MG/0.3ML IJ SOAJ: 0.3000 mg | Freq: Once | INTRAMUSCULAR | 2 refills | Status: AC

## 2024-05-09 NOTE — Progress Notes (Signed)
 FOLLOW UP  Date of Service/Encounter:  05/09/24   Assessment:   Food allergies (wheat, orange, sesame, seafood, nuts, peanuts) - interested in starting Xolair  Multiple other food intolerances  Weight loss  Plan/Recommendations:   1. Allergic reaction with lip swelling.  - Testing in the blood was positive to wheat, orange, sesame, seafood, nuts, peanuts.  - I would avoid all of these. - Try introducing milk and egg and red meat back into your diet. - Consent signed to get Xolair on board (this might help you to introduce more foods into your diet). - Tammy helps us  with approving all of our biologics (injectable medications), so she will reach out to you with the process to get this started.  - I would try doing cetirizine 1-2 times per day to tamp down the reactions. - EpiPen  training reviewed.  - We are going to get some labs to look for parasites due to your gastrointestinal complaints. - We are also getting s ome vitamin levels and iron studies due to your weight loss. - We are going to refer you to see a Registered Dietician.  - We are going to refer you to see a Gastroenterologist.   2. Return in about 3 months (around 08/09/2024). You can have the follow up appointment with Dr. Iva or a Nurse Practicioner (our Nurse Practitioners are excellent and always have Physician oversight!).    Subjective:   Damon Stevens is a 38 y.o. male presenting today for follow up of  Chief Complaint  Patient presents with   Follow-up   Allergies    Wants to discuss his allergies, and recent test results    Somtochukwu Woollard has a history of the following: There are no active problems to display for this patient.   History obtained from: chart review and patient.  Discussed the use of AI scribe software for clinical note transcription with the patient and/or guardian, who gave verbal consent to proceed.  Damon Stevens is a 38 y.o. male presenting for a follow up  visit.  Problem we last saw him in September 2025.  At that time, we got a slew of labs to follow-up on his food allergies.  We also got some labs to look for mast cell activation syndrome and talked about Xolair.  We did start him on Zyrtec twice daily.  IgE was elevated to wheat, orange, sesame, seafood, nuts, peanuts.  Egg and milk were barely positive and he recommended keeping his in his diet.  Alpha gal was barely positive, so we recommended keeping red meats in his diet.  Hereditary angioedema labs were normal.  Mast cell activation labs were all coming back as negative.  Since last visit, he has been about the same.  He has a history of multiple food allergies, including wheat, soy, orange, sesame, seafood, and nuts, confirmed by elevated levels in recent lab tests. He avoids these foods and has not seen a dietitian for management. A previous allergist performed a skin prick test, which was largely negative. He can consume eggs and milk in small amounts but experiences indigestion without hives or throat swelling. He avoids baked goods due to wheat content.  His current diet is very limited, primarily consisting of chicken and raspberries, and he has stopped consuming bread and pasta. He occasionally eats red meat but finds it expensive. He does not take Zyrtec regularly but uses Benadryl  as needed for allergic reactions, such as throat swelling and oral swelling after consuming cinnamon and cloves.  He really just does not want to take anything every day.  He says he is just careful about medications.  He has experienced significant weight loss, dropping from 195 pounds to 163 pounds. He reports a history of anemia and had a CBC in October. He was hospitalized in October for a tension headache, lightheadedness, and sore throat, which was treated with numbing medication rather than antibiotics.  No current diarrhea but reports grayish stools and occasional constipation. He experiences itching,  particularly at night, and suspects a parasitic infection. He has not undergone an endoscopy or seen a gastroenterologist. He tries to avoid medications unless necessary and is unsure if his EpiPen  is up to date.   He has been to the ED several times since we last saw him.  He went once on October 11 for a headache.  He went on October 5 for pharyngitis.  He went on September 30 for lightheadedness.  Before I saw him, he went to the ER for gastritis.  Again he has never seen gastroenterology.    Otherwise, there have been no changes to his past medical history, surgical history, family history, or social history.    Review of systems otherwise negative other than that mentioned in the HPI.    Objective:   Blood pressure 120/80, pulse 77, temperature (!) 97.4 F (36.3 C), temperature source Temporal, resp. rate 18, height 5' 10.08 (1.78 m), weight 163 lb 6.4 oz (74.1 kg), SpO2 99%. Body mass index is 23.39 kg/m.    Physical Exam Vitals reviewed.  Constitutional:      Appearance: He is well-developed.     Comments: Slightly anxious.  Seems depressed.  HENT:     Head: Normocephalic and atraumatic.     Right Ear: Tympanic membrane, ear canal and external ear normal. No drainage, swelling or tenderness. Tympanic membrane is not injected, scarred, erythematous, retracted or bulging.     Left Ear: Tympanic membrane, ear canal and external ear normal. No drainage, swelling or tenderness. Tympanic membrane is not injected, scarred, erythematous, retracted or bulging.     Nose: No nasal deformity, septal deviation, mucosal edema or rhinorrhea.     Right Turbinates: Not enlarged or swollen.     Left Turbinates: Not enlarged or swollen.     Right Sinus: No maxillary sinus tenderness or frontal sinus tenderness.     Left Sinus: No maxillary sinus tenderness or frontal sinus tenderness.     Mouth/Throat:     Mouth: Mucous membranes are not pale and not dry.     Pharynx: Uvula midline.  Eyes:      General:        Right eye: No discharge.        Left eye: No discharge.     Conjunctiva/sclera: Conjunctivae normal.     Right eye: Right conjunctiva is not injected. No chemosis.    Left eye: Left conjunctiva is not injected. No chemosis.    Pupils: Pupils are equal, round, and reactive to light.  Cardiovascular:     Rate and Rhythm: Normal rate and regular rhythm.     Heart sounds: Normal heart sounds.  Pulmonary:     Effort: Pulmonary effort is normal. No tachypnea, accessory muscle usage or respiratory distress.     Breath sounds: Normal breath sounds. No wheezing, rhonchi or rales.  Chest:     Chest wall: No tenderness.  Abdominal:     Tenderness: There is no abdominal tenderness. There is no guarding or rebound.  Lymphadenopathy:  Head:     Right side of head: No submandibular, tonsillar or occipital adenopathy.     Left side of head: No submandibular, tonsillar or occipital adenopathy.     Cervical: No cervical adenopathy.  Skin:    Coloration: Skin is not pale.     Findings: No abrasion, erythema, petechiae or rash. Rash is not papular, urticarial or vesicular.  Neurological:     Mental Status: He is alert.  Psychiatric:        Behavior: Behavior is cooperative.      Diagnostic studies: labs sent instead     Marty Shaggy, MD  [facility] 

## 2024-05-09 NOTE — Patient Instructions (Addendum)
 1. Allergic reaction with lip swelling.  - Testing in the blood was positive to wheat, orange, sesame, seafood, nuts, peanuts.  - I would avoid all of these. - Try introducing milk and egg and red meat back into your diet. - Consent signed to get Xolair on board (this might help you to introduce more foods into your diet). - Tammy helps us  with approving all of our biologics (injectable medications), so she will reach out to you with the process to get this started.  - I would try doing cetirizine 1-2 times per day to tamp down the reactions. - EpiPen  training reviewed.  - We are going to get some labs to look for parasites due to your gastrointestinal complaints. - We are also getting s ome vitamin levels and iron studies due to your weight loss. - We are going to refer you to see a Registered Dietician.  - We are going to refer you to see a Gastroenterologist.   2. Return in about 3 months (around 08/09/2024). You can have the follow up appointment with Dr. Iva or a Nurse Practicioner (our Nurse Practitioners are excellent and always have Physician oversight!).    Please inform us  of any Emergency Department visits, hospitalizations, or changes in symptoms. Call us  before going to the ED for breathing or allergy  symptoms since we might be able to fit you in for a sick visit. Feel free to contact us  anytime with any questions, problems, or concerns.  It was a pleasure to meet you today!  Websites that have reliable patient information: 1. American Academy of Asthma, Allergy , and Immunology: www.aaaai.org 2. Food Allergy  Research and Education (FARE): foodallergy.org 3. Mothers of Asthmatics: http://www.asthmacommunitynetwork.org 4. [employer] of Allergy , Asthma, and Immunology: www.acaai.org      "Like" us  on Facebook and Instagram for our latest updates!      A healthy democracy works best when [employer] participate! Make sure you are registered to vote! If you have  moved or changed any of your contact information, you will need to get this updated before voting! Scan the QR codes below to learn more!       Component     Latest Ref Rng 03/28/2024  Class Description Allergens Comment   F017-IgE Hazelnut ([place])     Class II kU/L 1.11 !   F256-IgE Walnut     Class II kU/L 1.29 !   F202-IgE Cashew Nut     Class II kU/L 0.64 !   F018-IgE [place] Nut     Class 0/I kU/L 0.21 !   Peanut , IgE     Class III kU/L 1.79 !   Macadamia Nut, IgE     Class III kU/L 1.44 !   Pecan Nut IgE     Class I kU/L 0.41 !   F203-IgE Pistachio Nut     Class III kU/L 1.96 !   F020-IgE Almond     Class III kU/L 1.47 !   Codfish IgE     Class 0 kU/L <0.10   F023-IgE Crab     Class II kU/L 1.34 !   Shrimp IgE     Class II kU/L 0.73 !   Tuna     Class 0 kU/L <0.10   Allergen Salmon IgE     Class 0 kU/L <0.10   F080-IgE Lobster     Class II kU/L 0.71 !   Catfish     Class 0 kU/L <0.10   F422-IgE Ara h 1  Class 0 kU/L <0.10   F423-IgE Ara h 2     Class 0 kU/L <0.10   F424-IgE Ara h 3     Class 0 kU/L <0.10   F447-IgE Ara h 6     Class 0 kU/L <0.10   F352-IgE Ara h 8     Class 0 kU/L <0.10   F427-IgE Ara h 9     Class 0 kU/L <0.10   IgE (Immunoglobulin E), Serum     6 - 495 IU/mL 340   Pork IgE     Class 0 kU/L <0.10   Beef IgE     Class 0/I kU/L 0.12 !   Allergen Lamb IgE     Class 0 kU/L <0.10   O215-IgE Alpha-Gal     Class 0 kU/L <0.10   Cor A 1 IgE     Class 0 kU/L <0.10   Cor A 8 IgE     Class 0 kU/L <0.10   Cor A 9 IgE     Class I kU/L 0.38 !   Cor A 14 IgE     Class 0 kU/L <0.10   F076-IgE Alpha Lactalbumin     Class 0/I kU/L 0.12 !   F077-IgE Beta Lactoglobulin     Class 0/I kU/L 0.10 !   F078-IgE Casein     Class 0 kU/L <0.10   Clam IgE     Class II kU/L 0.76 !   F290-IgE Oyster     Class II kU/L 1.24 !   Scallop IgE     Class II kU/L 1.24 !   F232-IgE Ovalbumin     Class 0 kU/L <0.10   F233-IgE Ovomucoid     Class  0/I kU/L 0.13 !   Complement C3, Serum     82 - 167 mg/dL 854   Complement C4, Serum     12 - 38 mg/dL 33   Jug R 1 IgE     Class 0/I kU/L 0.12 !   Jug R 3 IgE     Class 0 kU/L <0.10   Wheat IgE     Class III kU/L 1.46 !   Soybean IgE     Class III kU/L 1.43 !   Orange     Class II kU/L 1.22 !   IgE Egg (Yolk)     Class 0 kU/L <0.10   Tryptase     2.2 - 13.2 ug/L 4.9   Sesame Seed IgE     Class III kU/L 1.85 !   Prostaglandin D2, serum     pg/mL 151   C1INH SerPl-mCnc     21 - 39 mg/dL 36   R8PWY Qlwrupnwjo/R8PWY Total MFr SerPl     %mean normal >105   Complement C1Q     10.2 - 20.3 mg/dL 87.9   Allergen Comments Note   ANA O 3 IgE     Class 0 kU/L <0.10   Ber E 1 IgE     Class 0 kU/L <0.10

## 2024-05-14 ENCOUNTER — Telehealth: Payer: Self-pay | Admitting: Allergy & Immunology

## 2024-05-14 NOTE — Telephone Encounter (Signed)
 Heinz has been referred to [facility].  They will reach out to the patient to schedule.

## 2024-05-15 LAB — IRON,TIBC AND FERRITIN PANEL
Ferritin: 140 ng/mL (ref 30–400)
Iron Saturation: 18 % (ref 15–55)
Iron: 57 ug/dL (ref 38–169)
Total Iron Binding Capacity: 322 ug/dL (ref 250–450)
UIBC: 265 ug/dL (ref 111–343)

## 2024-05-15 LAB — ZINC: Zinc: 101 ug/dL (ref 44–115)

## 2024-05-15 LAB — VITAMIN B6: Vitamin B6: 17.8 ug/L (ref 3.4–65.2)

## 2024-05-15 LAB — VITAMIN B12: Vitamin B-12: 863 pg/mL (ref 232–1245)

## 2024-05-15 LAB — VITAMIN D 25 HYDROXY (VIT D DEFICIENCY, FRACTURES): Vit D, 25-Hydroxy: 51.5 ng/mL (ref 30.0–100.0)

## 2024-05-15 LAB — TOXOCARA AB, IGG, S

## 2024-05-15 LAB — STRONGYLOIDES, AB, IGG: Strongyloides, Ab, IgG: NEGATIVE

## 2024-05-15 LAB — RETICULOCYTES: Retic Ct Pct: 1.2 % (ref 0.6–2.6)

## 2024-05-17 ENCOUNTER — Ambulatory Visit: Payer: Self-pay | Admitting: Allergy & Immunology

## 2024-05-20 ENCOUNTER — Emergency Department

## 2024-05-20 ENCOUNTER — Emergency Department
Admission: EM | Admit: 2024-05-20 | Discharge: 2024-05-20 | Disposition: A | Discharge status: Home / Self Care | Attending: Emergency Medicine | Admitting: Emergency Medicine

## 2024-05-20 ENCOUNTER — Other Ambulatory Visit: Payer: Self-pay

## 2024-05-20 DIAGNOSIS — E876 Hypokalemia: Secondary | ICD-10-CM | POA: Insufficient documentation

## 2024-05-20 DIAGNOSIS — F410 Panic disorder [episodic paroxysmal anxiety] without agoraphobia: Secondary | ICD-10-CM | POA: Insufficient documentation

## 2024-05-20 DIAGNOSIS — R0789 Other chest pain: Secondary | ICD-10-CM | POA: Diagnosis present

## 2024-05-20 LAB — CBC
HCT: 38.3 % — ABNORMAL LOW (ref 39.0–52.0)
Hemoglobin: 13.4 g/dL (ref 13.0–17.0)
MCH: 29.8 pg (ref 26.0–34.0)
MCHC: 35 g/dL (ref 30.0–36.0)
MCV: 85.1 fL (ref 80.0–100.0)
Platelets: 238 K/uL (ref 150–400)
RBC: 4.5 MIL/uL (ref 4.22–5.81)
RDW: 12.2 % (ref 11.5–15.5)
WBC: 9.1 K/uL (ref 4.0–10.5)
nRBC: 0 % (ref 0.0–0.2)

## 2024-05-20 LAB — BASIC METABOLIC PANEL WITH GFR
Anion gap: 11 (ref 5–15)
BUN: 13 mg/dL (ref 6–20)
CO2: 23 mmol/L (ref 22–32)
Calcium: 8.9 mg/dL (ref 8.9–10.3)
Chloride: 102 mmol/L (ref 98–111)
Creatinine, Ser: 0.89 mg/dL (ref 0.61–1.24)
GFR, Estimated: >60 mL/min (ref >60)
Glucose, Bld: 106 mg/dL — ABNORMAL HIGH (ref 70–99)
Potassium: 3.4 mmol/L — ABNORMAL LOW (ref 3.5–5.1)
Sodium: 136 mmol/L (ref 135–145)

## 2024-05-20 LAB — TROPONIN I (HIGH SENSITIVITY): Troponin I (High Sensitivity): <2 ng/L (ref <18)

## 2024-05-20 NOTE — ED Provider Notes (Signed)
 [facility] Provider Note    Event Date/Time   First MD Initiated Contact with Patient 05/20/24 0112     (approximate)   History   Chest Pain   HPI Damon Stevens is a 38 y.o. male  with frequent ED utilization (10 times in the last six months to a variety of EDs and hospital systems with no admissions) who presents for evaluation of a variety of complaints.  He reports that he was having a heated argument with his girlfriend while driving, and he developed chest pain,  left sided body cramps, generalized weakness, and possibly some shortness of breath.  He said that he has anxiety and was not sure if he was having a panic attack but was afraid he was having a heart attack or stroke.  His symptoms resolved after coming to the ED.  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  He is active in [employer] and also goes to the [employer].  He has also been to hospitalist within the [facility] system recently.  He claims that he is currently stationed in [place].      Physical Exam   Triage Vital Signs: ED Triage Vitals  Encounter Vitals Group     BP 05/20/24 0022 136/86     Girls Systolic BP Percentile --      Girls Diastolic BP Percentile --      Boys Systolic BP Percentile --      Boys Diastolic BP Percentile --      Pulse Rate 05/20/24 0022 65     Resp 05/20/24 0022 16     Temp 05/20/24 0022 98 F (36.7 C)     Temp src --      SpO2 05/20/24 0022 98 %     Weight 05/20/24 0018 74.8 kg (165 lb)     Height 05/20/24 0018 1.829 m (6')     Head Circumference --      Peak Flow --      Pain Score 05/20/24 0018 7     Pain Loc --      Pain Education --      Exclude from Growth Chart --     Most recent vital signs: Vitals:   05/20/24 0022  BP: 136/86  Pulse: 65  Resp: 16  Temp: 98 F (36.7 C)  SpO2: 98%    General: Awake, no obvious  distress.  Healthy body habitus. CV:  Good peripheral perfusion.  Regular rate and rhythm, normal heart sounds. Resp:  Normal effort. Speaking easily and comfortably, no accessory muscle usage nor intercostal retractions.  Lungs are clear to auscultation bilaterally. Abd:  No distention.  Other:  No appreciable focal neurological deficits.   ED Results / Procedures / Treatments   Labs (all labs ordered are listed, but only abnormal results are displayed) Labs Reviewed  BASIC METABOLIC PANEL WITH GFR - Abnormal; Notable for the following components:      Result Value   Potassium 3.4 (*)    Glucose, Bld 106 (*)    All other components within normal limits  CBC - Abnormal; Notable for the following components:   HCT 38.3 (*)    All other components within normal limits  TROPONIN I (HIGH SENSITIVITY)     EKG  ED ECG REPORT I, Darleene Dome, the attending physician, personally viewed and interpreted this ECG.  Date: 05/20/2024 EKG  Time: 00:20 Rate: 65 Rhythm: normal sinus rhythm QRS Axis: normal Intervals: normal ST/T Wave abnormalities: normal Narrative Interpretation: no evidence of acute ischemia    RADIOLOGY I independently viewed and interpreted the patient's two-view chest x-ray.  I see no evidence of pneumothorax, pneumonia, nor other acute abnormality.  I also read the radiologist's report, which confirmed no acute findings.   PROCEDURES:  Critical Care performed: No  Procedures    IMPRESSION / MDM / ASSESSMENT AND PLAN / ED COURSE  I reviewed the triage vital signs and the nursing notes.                              Differential diagnosis includes, but is not limited to, anxiety/panic attack, hyperventilation, somatic symptom disorder, much less likely ACS, PE, pneumonia, CVA.  Patient's presentation is most consistent with acute presentation with potential threat to life or bodily function.  Labs/studies ordered: EKG, two-view chest x-ray,  high-sensitivity troponin, BMP, CBC  Interventions/Medications given:  Medications - No data to display  (Note:  hospital course my include additional interventions and/or labs/studies not listed above.)   Normal vitals, asymptomatic, very well-appearing, normal EKG and lab work.  Very low risk of ACS based on HEAR score, PERC negative, no focal neurological deficits.  Strongly suggestive of panic attack with hyperventilation possibly leading to some muscle cramps.  I reviewed multiple ED visits at multiple different hospitals for a variety of complaints.  He has also been here several times recently.  I provided reassurance and we talked about the high probability of panic attack and somatic symptom disorder.  I encouraged follow-up at the [facility] but also provide information about how to establish a local primary care doctor and I gave him the information about [facility] for help with the psychiatric component.  He understands and agrees.  The patient's medical screening exam is reassuring with no indication of an emergent medical condition requiring hospitalization or additional evaluation at this point.  The patient is safe and appropriate for discharge and outpatient follow up.         FINAL CLINICAL IMPRESSION(S) / ED DIAGNOSES   Final diagnoses:  Atypical chest pain  Panic attack     Rx / DC Orders   ED Discharge Orders     None        Note:  This document was prepared using Dragon voice recognition software and may include unintentional dictation errors.   Gordan Huxley, MD 05/20/24 [phone]

## 2024-05-20 NOTE — ED Triage Notes (Signed)
 Pt reports chest pain and left side body cramps that began PTA, pt able to move all extremities, no numbness or weakness. Pt denies known cardiac hx. Pt reports he was having an argument just prior to symptoms starting and has hx anxiety.

## 2024-05-20 NOTE — Discharge Instructions (Signed)
 You have been seen in the Emergency Department (ED) today for a variety of symptoms.  As we have discussed today's test results are normal, and we feel it is likely that panic attacks may be causing your symptoms.  Please follow up with the recommended doctor as instructed above in these documents regarding today's emergent visit and your recent symptoms to discuss further management.  Continue to take your regular medications.   Return to the Emergency Department (ED) if you experience any further chest pain/pressure/tightness, difficulty breathing, or sudden sweating, or other symptoms that concern you.

## 2024-05-20 NOTE — ED Notes (Signed)
 Resting comfortably on stretcher, no distress, mental status at baseline. Gait steady. Understands dc instructions.

## 2024-05-22 LAB — OVA AND PARASITE EXAMINATION

## 2024-05-28 NOTE — Telephone Encounter (Signed)
 Damon Stevens has been scheduled with [facility] for 12/10 at 10:30 am with Dr. Celestia and with [facility] for 12/12 at 10:00 am with Dr. Aubel.

## 2024-05-30 NOTE — ED Provider Notes (Addendum)
 HPI:  Chief Complaint  Patient presents with  . Allergic Reaction    HPI 38 year old male with history of anaphylaxis to peanuts and multiple food allergies presents for 1 hour of burning swelling sensation in his mouth and throat as well as abdominal cramping and nausea   Patient denies fever, chills, difficulty breathing, difficulty swallowing, chest pain, shortness of breath, diarrhea, constipation, rash, tongue swelling        Glasgow Coma Scale Score: 15                 Patient History:  No past medical history on file.  No past surgical history on file.  No family history on file.  Social History   Tobacco Use  . Smoking status: Not on file  . Smokeless tobacco: Not on file  Substance Use Topics  . Alcohol use: Not on file  . Drug use: Not on file     Review of Systems:  Review of Systems 10 point review of systems conducted and negative except as documented in HPI   Physical Exam:  ED Triage Vitals [05/30/24 1730]  Temp Heart Rate Resp BP  37 C (98.6 F) 70 16 (!) 143/94    SpO2 Temp Source Heart Rate Source Patient Position  100 % Oral Monitor Sitting    BP Location FiO2 (%)    Left arm --      Physical Exam Triage note and vital signs reviewed General: Well nourished, well developed, well appearing Head: Normocephalic, atraumatic,  Eyes: Anicteric, PERRL, EOMI, no conjunctival pallor Ears: No external lesions,  Nose: No flaring, bilateral nares clear,  Mouth: Mucous membranes moist, dentition appropriate Throat: Oropharynx clear, uvula midline, no exudates Neck: Supple, nontender, no JVD, no stridor on auscultation Cardiovascular: Regular rate and rhythm, no murmurs or rubs Pulmonary: Bilateral breath sounds clear on auscultation, good aeration, no increased work of breathing, no retractions. Abdomen: Soft, nontender, no guarding or rebound, no masses appreciable. Extremities: Moves all extremities well, no edema Neuro: Alert and oriented  4, no obvious FND, normal speech, normal gait and coordination observed Vascular: Symmetric pulses bilaterally, cap refill brisk Derm: Warm, dry, no rash Psych: Appropriate for situation,    ED Course & MDM  Clinical Impressions as of 05/30/24 1910  Anaphylaxis, initial encounter    Vital Signs Temp: 37 C (98.6 F) (05/30/2024  5:30 PM) Temp Source: Oral (05/30/2024  5:30 PM) Heart Rate: 70 (05/30/2024  5:30 PM) Heart Rate Source: Monitor (05/30/2024  5:30 PM) Resp: 16 (05/30/2024  5:30 PM) Resp Rate Source: Counted (05/30/2024  5:30 PM) BP: (!) 143/94 (05/30/2024  5:30 PM) Calculated BP MAP: 110 (05/30/2024  5:30 PM) BP Location: Left arm (05/30/2024  5:30 PM) BP Method: Automatic (05/30/2024  5:30 PM) Patient Position: Sitting (05/30/2024  5:30 PM) Level of Consciousness-NURSES ONLY: Alert (05/30/2024  5:39 PM) Restart Vitals Timer : Yes (05/30/2024  5:30 PM) Restart Vitals Timer: Yes (05/30/2024  5:30 PM)    Medical Decision Making Risk Prescription drug management.  -Well appearing young male, afebrile with stable vital signs presents to the ED for anaphylaxis like symptoms.     -Patient Medical Records and External Records Reviewed and Reveal: No medical history and summary, ED visits for headaches, gastritis, AKI and multiple visits for allergic reactions and urticaria and angioedema as well as anxiety with panic attacks.   -History and physical exam consistent with anaphylaxis giving 2 organ system involvement of mucosa and lip swelling and abdominal cramping.  Epinephrine , famotidine ,  Decadron ordered.  Patient did not use EpiPen  as it gives him anxiety.  Discussed risks and benefits and patient agreed to take IM epinephrine  here in the ED.  Patient has already taken Benadryl  and declined IV dose of Benadryl  later nursing informed me that patient refused IM epinephrine .  -I feel that the patient has decision-making capacity. The patient demonstrates ability to understand the  current situation and is able to communicate a choice for what the want to do. The patient expresses understanding of benefits, risks, and alternatives and is able to make logical and rational choices, even though they are not in line with my recommended medical direction. There is no evidence of intoxication or altered mentation which would preclude normal cognitive function.     - Labs and imaging not current indicated   -Very low current concern for bimodal anaphylaxis, severe anaphylaxis angioedema, aspiration, foreign body, or other emergent condition requiring further ED evaluation, admission, or procedure.   -Discussed findings and reasonable plans with patient; Agrees with plan to follow-up primary care provider.    - Patient given strict return precautions, discharge instructions, and existing diagnostic uncertainty; agree and expressed understanding.   -Patient pending discharge after 2 hours from administration of IM epinephrine .  Throughout ED stay patient had stable vital signs and was able to ambulate and tolerate oral intake.   --- -Discussion of Management with other Physicians, QHP, or Appropriate Source: N/A -Consideration of Admission, Observation, Transfer, or Escalation of Care: N/A -Social determinants that significantly affected care: None -Prescription drug(s) considered but not prescribed: None -Diagnostic tests considered but not performed: N/A   -- Disclaimer: This note was dictated by speech recognition. Errors in transcription may be present.          Sepsis Provider Doc     Carlin Victory Kitty, MD 05/30/24 1745    Carlin Victory Kitty, MD 05/30/24 1910    Carlin Victory Kitty, MD 05/30/24 1911

## 2024-06-02 ENCOUNTER — Other Ambulatory Visit: Payer: Self-pay

## 2024-06-02 ENCOUNTER — Emergency Department
Admission: EM | Admit: 2024-06-02 | Discharge: 2024-06-03 | Disposition: A | Discharge status: Home / Self Care | Attending: Emergency Medicine | Admitting: Emergency Medicine

## 2024-06-02 DIAGNOSIS — T7840XA Allergy, unspecified, initial encounter: Secondary | ICD-10-CM | POA: Diagnosis present

## 2024-06-02 MED ORDER — DEXAMETHASONE SOD PHOSPHATE PF 10 MG/ML IJ SOLN
10.0000 mg | Freq: Once | INTRAMUSCULAR | Status: AC
  Administered 2024-06-03: 10 mg via INTRAMUSCULAR

## 2024-06-02 NOTE — ED Triage Notes (Addendum)
 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 Pt reports taking 25mg  benadryl  pta. Pts speech is clear, does not appear in acute distress at this time

## 2024-06-03 NOTE — ED Provider Notes (Signed)
 [facility] Provider Note    Event Date/Time   First MD Initiated Contact with Patient 06/02/24 2338     (approximate)   History   Allergic Reaction   HPI  Damon Stevens is a 38 y.o. male who presents with complaints of an allergic reaction.  Review of medical records demonstrates multiple visits for reported allergic reactions, pattern is somewhat unusual and I question secondary gain.  He reports that he developed tightness in his throat around 5 PM, he took Benadryl  at home and has been doing overall well, no worsening, symptoms generally improving.  No rash     Physical Exam   Triage Vital Signs: ED Triage Vitals  Encounter Vitals Group     BP 06/02/24 2112 (!) 143/99     Girls Systolic BP Percentile --      Girls Diastolic BP Percentile --      Boys Systolic BP Percentile --      Boys Diastolic BP Percentile --      Pulse Rate 06/02/24 2112 65     Resp 06/02/24 2112 17     Temp 06/02/24 2112 98 F (36.7 C)     Temp src --      SpO2 06/02/24 2112 100 %     Weight 06/02/24 2111 74.8 kg (165 lb)     Height 06/02/24 2111 1.829 m (6')     Head Circumference --      Peak Flow --      Pain Score 06/02/24 2111 0     Pain Loc --      Pain Education --      Exclude from Growth Chart --     Most recent vital signs: Vitals:   06/02/24 2112 06/03/24 0001  BP: (!) 143/99 125/86  Pulse: 65 63  Resp: 17 16  Temp: 98 F (36.7 C)   SpO2: 100% 100%     General: Awake, no distress.  Comfortable CV:  Good peripheral perfusion.  Resp:  Normal effort.  Abd:  No distention.  Other:  Pharyngeal exam normal, no urticaria   ED Results / Procedures / Treatments   Labs (all labs ordered are listed, but only abnormal results are displayed) Labs Reviewed - No data to display   EKG     RADIOLOGY     PROCEDURES:  Critical Care performed:   Procedures   MEDICATIONS ORDERED IN ED: Medications  dexamethasone (DECADRON)  injection 10 mg (10 mg Intramuscular Given 06/03/24 0003)     IMPRESSION / MDM / ASSESSMENT AND PLAN / ED COURSE  I reviewed the triage vital signs and the nursing notes. Patient's presentation is most consistent with acute illness / injury with system symptoms.  Patient here for possible allergic reaction but has been over 5 hours since onset with no worsening.  Not consistent with anaphylaxis.  Will treat with IM Decadron although I doubt this is an allergic reaction but more related to anxiety.  No indication for admission or further observation in the emergency department given length of time since this began, appropriate for discharge outpatient follow-up        FINAL CLINICAL IMPRESSION(S) / ED DIAGNOSES   Final diagnoses:  Allergic reaction, initial encounter     Rx / DC Orders   ED Discharge Orders     None        Note:  This document was prepared using Dragon voice recognition software and may include unintentional dictation errors.  Arlander Charleston, MD 06/03/24 [phone]

## 2024-06-10 ENCOUNTER — Telehealth: Payer: Self-pay | Admitting: *Deleted

## 2024-06-10 MED ORDER — XOLAIR 300 MG/2ML ~~LOC~~ SOSY: 600.0000 mg | PREFILLED_SYRINGE | SUBCUTANEOUS | 11 refills | Status: DC

## 2024-06-10 NOTE — Telephone Encounter (Signed)
 Patient called regarding Xolair, unfortunately I had not seen note regarding same. I advised patient would send Rx to [facility] and fax notes to hopefully get Xolair approval for food allergy  and will they will reach out to patient directly

## 2024-06-11 NOTE — Telephone Encounter (Signed)
 Ooops - thank you for taking care of that!

## 2024-07-01 ENCOUNTER — Other Ambulatory Visit: Payer: Self-pay

## 2024-07-01 DIAGNOSIS — F4320 Adjustment disorder, unspecified: Secondary | ICD-10-CM | POA: Insufficient documentation

## 2024-07-01 DIAGNOSIS — I10 Essential (primary) hypertension: Secondary | ICD-10-CM | POA: Insufficient documentation

## 2024-07-02 NOTE — Progress Notes (Deleted)
 07/02/2024 Damon Stevens [id number] 1985/08/22  Gastroenterology Office Note    Referring Provider: Iva Marty Saltness, * Primary Care Physician:  [facility]  Primary GI Provider: Jinny Carmine, MD    Chief Complaint   No chief complaint on file.    History of Present Illness   Damon Stevens is a 38 y.o. male with PMHX of *** , presenting today at the request of Iva Marty Saltness, * due to weight loss, abdomnial pain and diarrhea?  Mutiple ER visits for allergic reactions,  04/28/2024 Emergency room visit for  04/23/2024  Emergency room vis for epigastric pain    Past Medical History:  Diagnosis Date   Anxiety 12/12/2019   Anxiety about health 12/12/2019   Cervical radiculopathy    Essential hypertension 07/01/2024   Essential hypertension     Grief reaction 07/01/2024   Grief reaction     Heartburn    Heat exhaustion 12/03/2016   Hypertension    Hypertensive disorder 12/12/2019   Left-sided chest pain 12/12/2019   Neck pain 04/05/2020   S/P laparoscopic appendectomy 10/08/2022    Past Surgical History:  Procedure Laterality Date   ADENOIDECTOMY      Current Outpatient Medications  Medication Sig Dispense Refill   albuterol  (PROVENTIL  HFA;VENTOLIN  HFA) 108 (90 Base) MCG/ACT inhaler Inhale 2 puffs into the lungs every 6 (six) hours as needed for wheezing or shortness of breath. (Patient not taking: Reported on 05/09/2024) 1 Inhaler 2   aspirin  EC 81 MG tablet Take 81 mg by mouth.     cyclobenzaprine (FLEXERIL) 5 MG tablet Take 10 mg by mouth.     famotidine  (PEPCID ) 20 MG tablet Take 20 mg by mouth.     omalizumab  (XOLAIR ) 300 MG/2  ML prefilled syringe Inject 600 mg into the skin every 28 (twenty-eight) days. 4 mL 11   ondansetron  (ZOFRAN -ODT) 4 MG disintegrating tablet Take 4 mg by mouth.     No current facility-administered medications for this visit.    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   Egg protein-containing drug products Anaphylaxis 12/20/2023   Lamotrigine Shortness Of Breath 05/04/2024   Milk (cow)  05/30/2024   Orange oil Anaphylaxis 12/19/2023   Other Nausea Only and Anaphylaxis 04/02/2024   Peanut -containing drug products Anaphylaxis 04/23/2024   Shellfish allergy  Hives, Hypertension, Other (See Comments), and Shortness Of Breath 12/30/2019   Shrimp extract Swelling 11/01/2020   Soy allergy  (obsolete) Anaphylaxis 04/23/2024   Wheat Hives 04/23/2024   Alitraq  12/03/2016   Milk-related compounds Nausea And Vomiting 05/04/2024   Ketorolac tromethamine Hypertension and Palpitations 06/20/2020    No family history on file.  Social History   Socioeconomic History   Marital status: Married    Spouse name: Not on file   Number of children: Not on file   Years of education: Not on file   Highest education level: Not on file  Occupational History   Not on file  Tobacco Use   Smoking status: Never   Smokeless tobacco: Never  Vaping Use   Vaping status: Never Used  Substance and Sexual Activity   Alcohol use: Never   Drug use: Never   Sexual activity: Not on file  Other Topics Concern   Not on file  Social History Narrative   Not on file   Social Drivers of Health   Financial Resource Strain: Not on file  Food Insecurity: Not on file  Transportation Needs: Not on file  Physical Activity: Not on file  Stress: Not on file  Social Connections: Not on file  Intimate Partner Violence: Not on file     RELEVANT GI HISTORY, IMAGING AND LABS: CBC    Component Value Date/Time   WBC 9.1 05/20/2024 0021   RBC 4.50 05/20/2024 0021   HGB 13.4 05/20/2024 0021   HCT 38.3 (L) 05/20/2024 0021   PLT 238 05/20/2024 0021   MCV 85.1 05/20/2024 0021   MCH 29.8 05/20/2024 0021   MCHC 35.0 05/20/2024 0021   RDW 12.2 05/20/2024 0021   LYMPHSABS 3.9 02/07/2024 2228   MONOABS 0.6 02/07/2024 2228    EOSABS 0.2 02/07/2024 2228   BASOSABS 0.1 02/07/2024 2228   Recent Labs    08/07/23 0027 01/08/24 0533 02/07/24 2228 04/23/24 1458 05/20/24 0021  HGB 14.1 13.4 14.4 14.1 13.4    CMP     Component Value Date/Time   NA 136 05/20/2024 0021   K 3.4 (L) 05/20/2024 0021   CL 102 05/20/2024 0021   CO2 23 05/20/2024 0021   GLUCOSE 106 (H) 05/20/2024 0021   BUN 13 05/20/2024 0021   CREATININE 0.89 05/20/2024 0021   CALCIUM 8.9 05/20/2024 0021   PROT 7.7 04/23/2024 1458   ALBUMIN 4.4 04/23/2024 1458   AST 25 04/23/2024 1458   ALT 28 04/23/2024 1458   ALKPHOS 75 04/23/2024 1458   BILITOT 0.8 04/23/2024 1458   GFRNONAA >60 05/20/2024 0021   GFRAA >60 01/07/2020 2009      Latest Ref Rng & Units 04/23/2024    2:58 PM 02/07/2024   10:28 PM 05/14/2019    1:22 AM  Hepatic Function  Total Protein 6.5 - 8.1 g/dL 7.7  8.1  8.0   Albumin 3.5 - 5.0 g/dL 4.4  4.4  4.5   AST 15 - 41 U/L 25  24  25    ALT 0 - 44 U/L 28  25  23    Alk Phosphatase 38 - 126 U/L 75  51  53   Total Bilirubin 0.0 - 1.2 mg/dL 0.8  0.8  0.7       Review of Systems   All systems reviewed and negative except where noted in HPI.    Physical Exam  There were no vitals taken for this visit. No LMP for male patient. General:   Alert and oriented. Pleasant and cooperative. Well-nourished and well-developed.  Head:  Normocephalic and atraumatic. Eyes:  Without icterus Ears:  Normal auditory acuity. Neck:  Supple; no masses or thyromegaly. Lungs:  Respirations even and unlabored.  Clear throughout to auscultation.   No wheezes, crackles, or rhonchi. No acute distress. Heart:  Regular rate and rhythm; no murmurs, clicks, rubs, or gallops. Abdomen:  Normal bowel sounds.  No bruits.  Soft, non-tender and non-distended without masses, hepatosplenomegaly or hernias noted.  No guarding or rebound tenderness.  ***Negative Carnett sign.   Rectal:  Deferred.***  Msk:  Symmetrical without gross deformities. Normal  posture. Extremities:  Without edema. Neurologic:  Alert and  oriented x4;  grossly normal neurologically. Skin:  Intact without significant lesions or rashes. Psych:  Alert and cooperative. Normal mood and affect.   Assessment & Plan   [name] is a 38 y.o. male presenting today with    I discussed the assessment and treatment plan with the patient. The patient was provided an opportunity to ask questions and all were answered. The patient agreed with the plan and demonstrated an understanding of  the instructions.   The patient was advised to call back or seek an in-person evaluation if the symptoms worsen or if the condition fails to improve as anticipated.  Grayce Bohr, DNP, AGNP-C [facility]

## 2024-07-03 ENCOUNTER — Ambulatory Visit: Admitting: Family Medicine

## 2024-07-05 ENCOUNTER — Telehealth: Payer: Self-pay | Admitting: Allergy & Immunology

## 2024-07-05 ENCOUNTER — Encounter: Attending: Allergy & Immunology | Admitting: Dietician

## 2024-07-05 ENCOUNTER — Encounter: Payer: Self-pay | Admitting: Dietician

## 2024-07-05 DIAGNOSIS — T7800XA Anaphylactic reaction due to unspecified food, initial encounter: Secondary | ICD-10-CM | POA: Insufficient documentation

## 2024-07-05 DIAGNOSIS — X58XXXA Exposure to other specified factors, initial encounter: Secondary | ICD-10-CM | POA: Diagnosis not present

## 2024-07-05 DIAGNOSIS — R1084 Generalized abdominal pain: Secondary | ICD-10-CM | POA: Insufficient documentation

## 2024-07-05 DIAGNOSIS — R634 Abnormal weight loss: Secondary | ICD-10-CM | POA: Diagnosis not present

## 2024-07-05 DIAGNOSIS — Z91018 Allergy to other foods: Secondary | ICD-10-CM

## 2024-07-05 NOTE — Progress Notes (Signed)
 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 Pt also reports abdominal pain and unintentional weight loss (-30 lbs x7 months). Pt states they have had bad allergic reactions after eating packaged foods without reading labels (pizza). Pt reports eating mostly chicken, potatoes, rice, and beans now. Pt states they feel very weak now, took an [employer] PFT last week and almost passed out.    Estimated daily fluid intake: 48-64 oz Supplements: None Sleep: Poor Stress / self-care: High r/t health decline and weight loss Current average weekly physical activity: None, too weak  24-Hr Dietary Recall First Meal: Rice, beans, chicken Snack:  Second Meal: Rice, beans, chicken Snack:  Third Meal:  Snack:  Beverages: Water, Powerade   NUTRITION DIAGNOSIS  [place]-3.2 Unintentional weight loss As related to food allergies.  As evidenced by weight loss of 30 lbs x7 months, muscle wasting, numerous ED visits for allergic reactions to food, epigastric pain.   NUTRITION INTERVENTION  Nutrition education (E-1) on the following topics:  Educated patient on avoidance of the 9 major food allergens, including label reading and cross contamination. Educated  patient on appropriate high calorie protein sources to encourage healthy regain of lost weight.  Handouts Provided Include  Multiple Food Allergies Sesame Allergy  Nutrition Therapy Fish Allergy  Nutrition Therapy Peanut  Allergy  Nutrition Therapy Wheat Allergy  Nutrition Therapy   Learning Style & Readiness for Change Teaching method utilized: Visual & Auditory  Demonstrated degree of understanding via: Teach Back  Barriers to learning/adherence to lifestyle change: Food allergies  Goals Established by Pt Have Ripple brand pea milk for an allergen free source of protein! Try to have some pumpkin seeds or sunflower seeds as a snack. Have a small amount to assess tolerance. You can also try [place] as a peanut  butter replacement for a high calorie, high protein food. Try Barilla Chickpea pasta for a wheat free, high protein option! Add avocado to your meals for a healthy, high calorie boost! Add in an allergen-free daily multivitamin to ensure you are getting all your nutrients! Have high fat beef (steak, ground beef) for another high calorie, high protein food!   MONITORING & EVALUATION Dietary intake, weekly physical activity, and weight gain in 6 weeks.  Next Steps  Patient is to set up visit with gastroenterology, follow up with RD.

## 2024-07-05 NOTE — Telephone Encounter (Signed)
 PT called to get directions to Nutrionist while at [facility] - I advised was not aware of that location and recommended asking hospital staff.  PT stated that I was not being helpful, and I again advised was not familiar with that location. Further advised that their website indicates the ground floor, but would ask hospital staff/volunteers at his location where he might need to go. PT swore and said he'd walk around until he either found the location or missed the appointment.

## 2024-07-05 NOTE — Patient Instructions (Addendum)
 Have Ripple brand pea milk for an allergen free source of protein!  Try to have some pumpkin seeds or sunflower seeds as a snack. Have a small amount to assess tolerance. You can also try [place] as a peanut  butter replacement for a high calorie, high protein food.  Try Barilla Chickpea pasta for a wheat free, high protein option!  Add avocado to your meals for a healthy, high calorie boost!  Add in an allergen-free daily multivitamin to ensure you are getting all your nutrients!  Have high fat beef (steak, ground beef) for another high calorie, high protein food!

## 2024-08-11 ENCOUNTER — Other Ambulatory Visit: Payer: Self-pay

## 2024-08-11 ENCOUNTER — Emergency Department
Admission: EM | Admit: 2024-08-11 | Discharge: 2024-08-11 | Disposition: A | Discharge status: Home / Self Care | Attending: Emergency Medicine | Admitting: Emergency Medicine

## 2024-08-11 DIAGNOSIS — T7840XA Allergy, unspecified, initial encounter: Secondary | ICD-10-CM | POA: Diagnosis present

## 2024-08-11 MED ORDER — DIPHENHYDRAMINE HCL 25 MG PO CAPS
50.0000 mg | ORAL_CAPSULE | Freq: Once | ORAL | Status: AC
  Administered 2024-08-11: 50 mg via ORAL
  Filled 2024-08-11: qty 2

## 2024-08-11 MED ORDER — FAMOTIDINE 20 MG PO TABS
20.0000 mg | ORAL_TABLET | Freq: Once | ORAL | Status: AC
  Administered 2024-08-11: 20 mg via ORAL
  Filled 2024-08-11: qty 1

## 2024-08-11 NOTE — ED Triage Notes (Addendum)
 Pt reports he drank some passion fruit juice tonight and has known allergy  to orange juice. Pt reports feeling like his throat is swelling. Pt took 25mg  benadryl  pta. Pt reports some difficulty swallowing and breathing.

## 2024-08-11 NOTE — ED Provider Notes (Signed)
 "  [facility] Provider Note    Event Date/Time   First MD Initiated Contact with Patient 08/11/24 1904     (approximate)   History   Allergic Reaction   HPI  Damon Stevens is a 39 y.o. male past medical history significant for frequent allergic reactions who presents to the emergency department with concern for allergic reaction.  States that he drank passion fruit juice and then started feeling like his throat was itchy and swollen.  Denies any pruritic rash.  Denies nausea, vomiting or diarrhea.  Denies any shortness of breath.  States that he took 25 mg of Benadryl  prior to arrival.  Requesting to not get any steroids because he states that gives him headaches and messes him up.  States that he has multiple food allergies and is followed by 2 different allergy  specialist.  In chart review patient has been evaluated multiple times in the emergency department for various different allergic reaction symptoms.     Physical Exam   Triage Vital Signs: ED Triage Vitals  Encounter Vitals Group     BP 08/11/24 1902 (!) 151/98     Girls Systolic BP Percentile --      Girls Diastolic BP Percentile --      Boys Systolic BP Percentile --      Boys Diastolic BP Percentile --      Pulse Rate 08/11/24 1902 72     Resp 08/11/24 1902 19     Temp 08/11/24 1902 97.7 F (36.5 C)     Temp src --      SpO2 08/11/24 1902 100 %     Weight 08/11/24 1901 162 lb (73.5 kg)     Height 08/11/24 1901 6' (1.829 m)     Head Circumference --      Peak Flow --      Pain Score 08/11/24 1901 0     Pain Loc --      Pain Education --      Exclude from Growth Chart --     Most recent vital signs: Vitals:   08/11/24 1902  BP: (!) 151/98  Pulse: 72  Resp: 19  Temp: 97.7 F (36.5 C)  SpO2: 100%    Physical Exam Constitutional:      Appearance: He is well-developed.     Comments: Speaking in full sentences with no signs of respiratory distress  HENT:     Head:  Atraumatic.     Mouth/Throat:     Mouth: Mucous membranes are moist.     Pharynx: Oropharynx is clear. No pharyngeal swelling, posterior oropharyngeal erythema or uvula swelling.     Comments: No obvious edema.  No obvious urticaria. Eyes:     Conjunctiva/sclera: Conjunctivae normal.  Cardiovascular:     Rate and Rhythm: Regular rhythm.  Pulmonary:     Effort: No respiratory distress.  Musculoskeletal:     Cervical back: Normal range of motion.  Skin:    General: Skin is warm.  Neurological:     Mental Status: He is alert. Mental status is at baseline.     IMPRESSION / MDM / ASSESSMENT AND PLAN / ED COURSE  I reviewed the triage vital signs and the nursing notes.  Differential diagnosis including allergic reaction, anaphylactic reaction, secondary gain  LABS (all labs ordered are listed, but only abnormal results are displayed) Labs interpreted as -    Labs Reviewed - No data to display   MDM  Patient requesting to not  receive any steroids at this time.  Will give a dose of oral Benadryl  and famotidine  and reevaluate.  Do not feel that the patient meets criteria for anaphylaxis and does not need epinephrine  at this time.  Will continue to monitor.  On reevaluation states he is feeling much better.  No longer with feeling of throat closing.  States that he just feels drowsy from Benadryl .  Discussed calling a ride to drive him home.  No questions or concerns at time of discharge.  Discussed return precautions for any worsening symptoms.     PROCEDURES:  Critical Care performed: No  Procedures  Patient's presentation is most consistent with acute presentation with potential threat to life or bodily function.   MEDICATIONS ORDERED IN ED: Medications  diphenhydrAMINE  (BENADRYL ) capsule 50 mg (50 mg Oral Given 08/11/24 1924)  famotidine  (PEPCID ) tablet 20 mg (20 mg Oral Given 08/11/24 1924)    FINAL CLINICAL IMPRESSION(S) / ED DIAGNOSES   Final diagnoses:  Allergic  reaction, initial encounter     Rx / DC Orders   ED Discharge Orders     None        Note:  This document was prepared using Dragon voice recognition software and may include unintentional dictation errors.   Suzanne Kirsch, MD 08/11/24 2033  "

## 2024-08-11 NOTE — ED Notes (Signed)
 Pt informed by doctor and nurse that he will need a ride home after receiving allergy  medication. Pt agreed to find ride vs driving home. Pt requesting more time to find ride/rest in room before d/c. Pt resting in room for now.

## 2024-08-15 ENCOUNTER — Encounter: Payer: Self-pay | Admitting: Allergy & Immunology

## 2024-08-15 ENCOUNTER — Other Ambulatory Visit: Payer: Self-pay

## 2024-08-15 ENCOUNTER — Ambulatory Visit: Admitting: Allergy & Immunology

## 2024-08-15 VITALS — BP 126/88 | HR 76 | Temp 98.3°F | Resp 16 | Ht 72.0 in | Wt 161.6 lb

## 2024-08-15 DIAGNOSIS — R634 Abnormal weight loss: Secondary | ICD-10-CM | POA: Diagnosis not present

## 2024-08-15 DIAGNOSIS — R220 Localized swelling, mass and lump, head: Secondary | ICD-10-CM

## 2024-08-15 DIAGNOSIS — R221 Localized swelling, mass and lump, neck: Secondary | ICD-10-CM | POA: Diagnosis not present

## 2024-08-15 DIAGNOSIS — T7800XA Anaphylactic reaction due to unspecified food, initial encounter: Secondary | ICD-10-CM

## 2024-08-15 DIAGNOSIS — T7800XD Anaphylactic reaction due to unspecified food, subsequent encounter: Secondary | ICD-10-CM

## 2024-08-15 DIAGNOSIS — R1084 Generalized abdominal pain: Secondary | ICD-10-CM | POA: Diagnosis not present

## 2024-08-15 NOTE — Progress Notes (Signed)
 "  FOLLOW UP  Date of Service/Encounter:  08/15/24   Assessment:   Food allergies (wheat, orange, sesame, seafood, nuts, peanuts) - interested in starting Xolair    Multiple other food intolerances vs allergies - with positive testing to multiple foods    Weight loss - with continued loss (2 pounds since the last visit)  Plan/Recommendations:   1. Allergic reaction with lip swelling.  - Previous testing in the past showed positives to wheat, orange, sesame, seafood, nuts, peanuts - I would definitely avoid all of these. - Call the pharmacy to check on the Xolair  (they won't talk to any providers outside of the [place]). - Call GI to schedule that appointment (printed off the letter from [facility]). - Letter written for [place] stating your food allergies.  - I hope the Xolair  can help you, as you continue to lose weight which is problem.   2. Return in about 6 months (around 02/12/2025). You can have the follow up appointment with Dr. Iva or a Nurse Practicioner (our Nurse Practitioners are excellent and always have Physician oversight!).   Subjective:   Damon Stevens is a 39 y.o. male presenting today for follow up of  Chief Complaint  Patient presents with   Follow-up   Allergic Reaction    2 days ago, he ate passion fruit and went to ER. He needs a profile to sent to [employer] foods.     Damon Stevens has a history of the following: There are no active problems to display for this patient.   History obtained from: chart review and patient.  Discussed the use of AI scribe software for clinical note transcription with the patient and/or guardian, who gave verbal consent to proceed.  Damon Stevens is a 39 y.o. male presenting for a follow up visit.  He was last seen in October 2025.  At that time, he was still experiencing some lip swelling episodes.  He had testing in the past has been positive to wheat, orange, sesame, seafood, nuts, and peanuts.  We  recommended putting milk and eggs as well as red meat back into his diet.  We talked about Xolair  to help with immunomodulation and decrease in episodes.  We also recommended doing cetirizine 1-2 times a day.  He was having a lot of gastrointestinal complaints, so we obtained some studies to look for evidence of parasitic infections.  These were all normal.  We obtained some vitamin labs as well and referred him to see gastroenterology as well as a registered dietitian.  He was having some weight loss.  His weight was 163 pounds at the last visit and is 161 pounds today.  In the interim, it looks like he showed up to the emergency room in [place] with a diagnosis of presyncope.  He also showed up at the emergency room at [facility] on January 18 with an allergic reaction after drinking the juice containing passion fruit  He did see a registered dietitian on December 12.  It was recommended that he try Ripple milk as well as pumpkin and sunflower seeds.  He was also encouraged to try chickpea containing pasta as well as avocados and a multivitamin.  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 He describes being 'allergic to almost everything' and is limited to eating chicken soup, rice, beans, and chicken. He is unable to consume red meats, eggs, or  avocados, and has had adverse reactions to pea milk (Ripple).  He has been in contact with a dietitian who provided some alternatives, but he continues to struggle with maintaining a balanced diet. He eats only once or twice a day, leading to weight loss. No diarrhea, but he mentions occasional constipation. He has not yet seen a gastroenterologist but has a referral pending. We are providing him with the phone number since he seems to have missed calls from them per the EMR.  He is awaiting approval for Xolair , which was prescribed  and sent to the [facility], but he has not received confirmation of approval. The prescription was sent to the [facility], but he has not received confirmation of approval.  I talked to Tammy, our English as a second language teacher, and she reported that were not allowed to talk to the [place] so the patient is going to have to call the pharmacy to check on the status.  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 Chopp, Medical Case Manager for [employer].  This needs to lay out his limitations as well as his test results.  Otherwise, there have been no changes to his past medical history, surgical history, family history, or social history.    Review of systems otherwise negative other than that mentioned in the HPI.    Objective:   Blood pressure 126/88, pulse 76, temperature 98.3 F (36.8 C), temperature source Temporal, resp. rate 16, height 6' (1.829 m), weight 161 lb 9.6 oz (73.3 kg), SpO2 99%. Body mass index is 21.92 kg/m.    Physical Exam Vitals reviewed.  Constitutional:      Appearance: He is well-developed.     Comments: Slightly anxious.  Seems depressed.  HENT:     Head: Normocephalic and atraumatic.     Right Ear: Tympanic membrane, ear canal and external ear normal. No drainage, swelling or tenderness. Tympanic membrane is not injected, scarred, erythematous, retracted or bulging.     Left Ear: Tympanic membrane, ear canal and external ear normal. No drainage, swelling or tenderness. Tympanic membrane is not injected, scarred, erythematous, retracted or bulging.     Nose: No nasal deformity, septal deviation, mucosal edema or rhinorrhea.     Right Turbinates: Not enlarged or swollen.     Left Turbinates: Not enlarged or swollen.     Right Sinus: No maxillary sinus tenderness or frontal sinus tenderness.     Left Sinus: No maxillary sinus tenderness or frontal sinus tenderness.     Mouth/Throat:     Mouth: Mucous membranes  are not pale and not dry.     Pharynx: Uvula midline.  Eyes:     General:        Right eye: No discharge.        Left eye: No discharge.     Conjunctiva/sclera: Conjunctivae normal.     Right eye: Right conjunctiva is not injected. No chemosis.    Left eye: Left conjunctiva is not injected. No chemosis.    Pupils: Pupils are equal, round, and reactive to light.  Cardiovascular:     Rate and Rhythm: Normal rate and regular rhythm.     Heart sounds: Normal heart sounds.  Pulmonary:     Effort: Pulmonary effort is normal. No tachypnea, accessory muscle usage or respiratory distress.     Breath sounds: Normal breath sounds. No wheezing, rhonchi or rales.  Chest:     Chest wall: No  tenderness.  Lymphadenopathy:     Head:     Right side of head: No submandibular, tonsillar or occipital adenopathy.     Left side of head: No submandibular, tonsillar or occipital adenopathy.     Cervical: No cervical adenopathy.  Skin:    Coloration: Skin is not pale.     Findings: No abrasion, erythema, petechiae or rash. Rash is not papular, urticarial or vesicular.  Neurological:     Mental Status: He is alert.  Psychiatric:        Behavior: Behavior is cooperative.      Diagnostic studies: none        Marty Shaggy, MD  [facility]        "

## 2024-08-15 NOTE — Patient Instructions (Addendum)
 1. Allergic reaction with lip swelling.  - Previous testing in the past showed positives to wheat, orange, sesame, seafood, nuts, peanuts - I would definitely avoid all of these. - Call the pharmacy to check on the Xolair  (they won't talk to any providers outside of the [place]). - Call GI to schedule that appointment (printed off the letter from [facility]). - Letter written for [place] stating your food allergies.  - I hope the Xolair  can help you, as you continue to lose weight which is problem.   2. Return in about 6 months (around 02/12/2025). You can have the follow up appointment with Dr. Iva or a Nurse Practicioner (our Nurse Practitioners are excellent and always have Physician oversight!).    Please inform us  of any Emergency Department visits, hospitalizations, or changes in symptoms. Call us  before going to the ED for breathing or allergy  symptoms since we might be able to fit you in for a sick visit. Feel free to contact us  anytime with any questions, problems, or concerns.  It was a pleasure to see you again today!  Websites that have reliable patient information: 1. American Academy of Asthma, Allergy , and Immunology: www.aaaai.org 2. Food Allergy  Research and Education (FARE): foodallergy.org 3. Mothers of Asthmatics: http://www.asthmacommunitynetwork.org 4. [employer] of Allergy , Asthma, and Immunology: www.acaai.org      Like us  on [employer] and Instagram for our latest updates!      A healthy democracy works best when [employer] participate! Make sure you are registered to vote! If you have moved or changed any of your contact information, you will need to get this updated before voting! Scan the QR codes below to learn more!       Component     Latest Ref Rng 03/28/2024  Class Description Allergens Comment   F017-IgE Hazelnut ([place])     Class II kU/L 1.11 !   F256-IgE Walnut     Class II kU/L 1.29 !   F202-IgE Cashew Nut     Class II kU/L  0.64 !   F018-IgE Brazil Nut     Class 0/I kU/L 0.21 !   Peanut , IgE     Class III kU/L 1.79 !   Macadamia Nut, IgE     Class III kU/L 1.44 !   Pecan Nut IgE     Class I kU/L 0.41 !   F203-IgE Pistachio Nut     Class III kU/L 1.96 !   F020-IgE Almond     Class III kU/L 1.47 !   Codfish IgE     Class 0 kU/L <0.10   F023-IgE Crab     Class II kU/L 1.34 !   Shrimp IgE     Class II kU/L 0.73 !   Tuna     Class 0 kU/L <0.10   Allergen Salmon IgE     Class 0 kU/L <0.10   F080-IgE Lobster     Class II kU/L 0.71 !   Catfish     Class 0 kU/L <0.10   F422-IgE Ara h 1     Class 0 kU/L <0.10   F423-IgE Ara h 2     Class 0 kU/L <0.10   F424-IgE Ara h 3     Class 0 kU/L <0.10   F447-IgE Ara h 6     Class 0 kU/L <0.10   F352-IgE Ara h 8     Class 0 kU/L <0.10   F427-IgE Ara h 9  Class 0 kU/L <0.10   IgE (Immunoglobulin E), Serum     6 - 495 IU/mL 340   Pork IgE     Class 0 kU/L <0.10   Beef IgE     Class 0/I kU/L 0.12 !   Allergen Lamb IgE     Class 0 kU/L <0.10   O215-IgE Alpha-Gal     Class 0 kU/L <0.10   Cor A 1 IgE     Class 0 kU/L <0.10   Cor A 8 IgE     Class 0 kU/L <0.10   Cor A 9 IgE     Class I kU/L 0.38 !   Cor A 14 IgE     Class 0 kU/L <0.10   F076-IgE Alpha Lactalbumin     Class 0/I kU/L 0.12 !   F077-IgE Beta Lactoglobulin     Class 0/I kU/L 0.10 !   F078-IgE Casein     Class 0 kU/L <0.10   Clam IgE     Class II kU/L 0.76 !   F290-IgE Oyster     Class II kU/L 1.24 !   Scallop IgE     Class II kU/L 1.24 !   F232-IgE Ovalbumin     Class 0 kU/L <0.10   F233-IgE Ovomucoid     Class 0/I kU/L 0.13 !   Complement C3, Serum     82 - 167 mg/dL 854   Complement C4, Serum     12 - 38 mg/dL 33   Jug R 1 IgE     Class 0/I kU/L 0.12 !   Jug R 3 IgE     Class 0 kU/L <0.10   Wheat IgE     Class III kU/L 1.46 !   Soybean IgE     Class III kU/L 1.43 !   Orange     Class II kU/L 1.22 !   IgE Egg (Yolk)     Class 0 kU/L <0.10   Tryptase     2.2 -  13.2 ug/L 4.9   Sesame Seed IgE     Class III kU/L 1.85 !   Prostaglandin D2, serum     pg/mL 151   C1INH SerPl-mCnc     21 - 39 mg/dL 36   R8PWY Qlwrupnwjo/R8PWY Total MFr SerPl     %mean normal >105   Complement C1Q     10.2 - 20.3 mg/dL 87.9   Allergen Comments Note   ANA O 3 IgE     Class 0 kU/L <0.10   Ber E 1 IgE     Class 0 kU/L <0.10

## 2024-08-23 ENCOUNTER — Encounter: Payer: Self-pay | Admitting: Dietician

## 2024-08-23 ENCOUNTER — Encounter: Attending: Allergy & Immunology | Admitting: Dietician

## 2024-08-23 VITALS — Ht 72.0 in | Wt 162.0 lb

## 2024-08-23 DIAGNOSIS — T7800XD Anaphylactic reaction due to unspecified food, subsequent encounter: Secondary | ICD-10-CM | POA: Insufficient documentation

## 2024-08-23 DIAGNOSIS — Z6821 Body mass index (BMI) 21.0-21.9, adult: Secondary | ICD-10-CM | POA: Insufficient documentation

## 2024-08-23 DIAGNOSIS — R634 Abnormal weight loss: Secondary | ICD-10-CM | POA: Insufficient documentation

## 2024-08-23 DIAGNOSIS — Z713 Dietary counseling and surveillance: Secondary | ICD-10-CM | POA: Insufficient documentation

## 2024-08-23 DIAGNOSIS — R1084 Generalized abdominal pain: Secondary | ICD-10-CM | POA: Insufficient documentation

## 2024-08-23 DIAGNOSIS — Z91018 Allergy to other foods: Secondary | ICD-10-CM | POA: Insufficient documentation

## 2024-08-23 NOTE — Patient Instructions (Addendum)
 Try lentils and quinoa for a higher protein grain/legume! Try a small serving of Bragg nutritional yeast and assess tolerance.  Avoid cross-contamination! Be sure to THOROUGHLY clean all counter tops, cookware, silverware, bowls, plates, cups, etc. With hot, soapy water prior to preparing and eating your meals.  Consider keeping a separate set of cookware and dishware for you to eat with.  Prepare your foods first and your spouse's foods second.

## 2024-08-23 NOTE — Progress Notes (Signed)
 Medical Nutrition Therapy  Appointment Start time:  1020  Appointment End time:  1100  Primary concerns today: Food allergies/Abdominal pain  Referral diagnosis: R63.4 (ICD-10-CM) - Weight loss, R10.84 (ICD-10-CM) - Generalized abdominal pain, T78.00XA (ICD-10-CM) - Allergy  with anaphylaxis due to food Preferred learning style: No preference indicated Learning readiness: Ready, change in progress   NUTRITION ASSESSMENT   Anthropometrics: Ht: 72 Wt: 162.0 BMI: 21.97 kg/m2  Clinical Medical Hx: Multiple food allergies, environmental allergies Medications: Reviewed Labs: Reviewed Notable Signs/Symptoms: Weight loss   Lifestyle & Dietary Hx Pt reports continuing GI pain and lip/throat swelling with various foods. Pt states they are waiting on approval for Xolair  medication from [place]. Pt reports suffering allergic reaction resulting in ED visit earlier this month, unsure as to what caused it. Pt reports severe low back pain for the last 2-3 weeks, saw ortho for an initial assessment, waiting to schedule CT scan, will be seeing neurology as well. Pt reports girlfriend prepares their food at home, states she uses separate cooking equipment. Pt reports still only being able to eat rice, beans, chicken twice daily without reaction, can eat bananas without reaction. Pt states Ripple milk and avocado have given them allergic reactions, states they get pain/cramping and minor throat swelling   Estimated daily fluid intake: 48-64 oz Supplements: None Sleep: Poor Stress / self-care: High r/t health decline and weight loss Current average weekly physical activity: None, too weak  24-Hr Dietary Recall First Meal: Rice, beans, chicken w/ avocado and potatoes Snack:  Second Meal: Rice, beans, chicken w/ avocado and potatoes Snack:  Third Meal:  Snack:  Beverages: Water, Powerade   NUTRITION DIAGNOSIS  [place]-3.2 Unintentional weight loss As related to food allergies.  As evidenced by weight  loss of 30 lbs x7 months, muscle wasting, numerous ED visits for allergic reactions to food, epigastric pain.   NUTRITION INTERVENTION  Nutrition education (E-1) on the following topics:  Educated patient on avoidance of the 9 major food allergens, including label reading and cross contamination. Educated patient on appropriate high calorie protein sources to encourage healthy regain of lost weight.  Handouts Provided Include  Multiple Food Allergies Sesame Allergy  Nutrition Therapy Fish Allergy  Nutrition Therapy Peanut  Allergy  Nutrition Therapy Wheat Allergy  Nutrition Therapy   Learning Style & Readiness for Change Teaching method utilized: Visual & Auditory  Demonstrated degree of understanding via: Teach Back  Barriers to learning/adherence to lifestyle change: Food allergies  Goals Established by Pt Try lentils and quinoa for a higher protein grain/legume! Try a small serving of Bragg nutritional yeast and assess tolerance. Avoid cross-contamination! Be sure to THOROUGHLY clean all counter tops, cookware, silverware, bowls, plates, cups, etc. With hot, soapy water prior to preparing and eating your meals. Consider keeping a separate set of cookware and dishware for you to eat with. Prepare your foods first and your spouse's foods second.   MONITORING & EVALUATION Dietary intake, weekly physical activity, and weight gain in 2-3 weeks.  Next Steps  Patient is to set up visit with gastroenterology, follow up with RD.

## 2024-08-26 ENCOUNTER — Other Ambulatory Visit: Payer: Self-pay

## 2024-08-29 NOTE — Progress Notes (Unsigned)
 "   08/30/2024 Damon Stevens [id number] 1986-06-27  Gastroenterology Office Note    Referring Provider: Iva Marty Saltness, MD Primary Care Physician:  [facility]  Primary GI Provider: Celestia Rima, NP; Theophilus Aloysius Fines, MD    Chief Complaint   Chief Complaint  Patient presents with   New Patient (Initial Visit)    Hx gastroenteritis- abdominal pain-also has food allergies- normal BMs except when he has flare up-no blood in stool-takes pepto or imodium when diarrhea occurs     History of Present Illness   Damon Stevens is a 39 y.o. male presenting today at the request of Iva Marty Saltness, MD due to chronic abdominal pain and diarrhea.  Discussed the use of AI scribe software for clinical note transcription with the patient, who gave verbal consent to proceed.  Abdominal pain began after appendectomy one year ago and has progressively worsened. Pain is localized to the mid-abdomen with radiation downward, occurring two to three times per week. Episodes are described as cramping and tender, sometimes improving after bowel movements. Pain is intermittent, triggered by certain foods such as soup and red meat.  He experiences intermittent diarrhea about once per week, often associated with allergic reactions or ingestion of foods that do not settle well. Diarrhea consists of loose, watery stools, sometimes more than three in a day. Pepto Bismol and Imodium provide symptomatic relief. Daily bowel movements are reported, with occasional constipation characterized by hard stools and mild straining.  Multiple food allergies include shrimp, orange juice, wheat, soy, milk, eggs, red meat, and nuts. Exposure results in facial swelling, hives, and sometimes requires Benadryl  or hospital visits. Diet is restricted to rice, beans, chicken, and chicken soup. Dairy intake leads to diarrhea, bloating, gas, and significant abdominal pain. Lactase supplements  have not been tried but are under consideration if reactions are not severe.  Significant weight loss has occurred since May or June of last year, from 195 pounds to 160 pounds as of January. He attributes this to dietary restrictions and inadequate caloric intake, expressing difficulty maintaining weight and satiety on his current diet. Charted Epic weights show that patient was 177 lb in Jan and Jun 2025.   No fever, chills, rectal bleeding, or melena, except for occasional dark stools after Pepto Bismol use. Frequent stress is reported, which may exacerbate symptoms.   Past Medical History:  Diagnosis Date   Anxiety 12/12/2019   Anxiety about health 12/12/2019   Cervical radiculopathy    Essential hypertension 07/01/2024   Essential hypertension     Grief reaction 07/01/2024   Grief reaction     Heartburn    Heat exhaustion 12/03/2016   Hypertension    Hypertensive disorder 12/12/2019   Left-sided chest pain 12/12/2019   Neck pain 04/05/2020   S/P laparoscopic appendectomy 10/08/2022    Past Surgical History:  Procedure Laterality Date   ADENOIDECTOMY     APPENDECTOMY      No current outpatient medications on file.   No current facility-administered medications for this visit.    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   Egg protein-containing drug products Anaphylaxis 12/20/2023   Lamotrigine Shortness Of Breath 05/04/2024   Milk (cow)  05/30/2024   Orange oil Anaphylaxis 12/19/2023   Other Nausea Only and Anaphylaxis 04/02/2024   Peanut -containing drug products Anaphylaxis and Other (See Comments) 04/23/2024   Shellfish allergy  Hives, Hypertension, Other (See Comments), Shortness Of Breath, and Swelling 12/30/2019  Shrimp extract Swelling 11/01/2020   Soy allergy  (obsolete) Anaphylaxis 04/23/2024   Tree extract Anaphylaxis 05/30/2024   Wheat Hives 04/23/2024   Alitraq  12/03/2016    Milk-related compounds Nausea And Vomiting 05/04/2024   Ketorolac tromethamine Hypertension and Palpitations 06/20/2020    Family History  Problem Relation Age of Onset   Stroke Mother     Social History   Socioeconomic History   Marital status: Married    Spouse name: Not on file   Number of children: Not on file   Years of education: Not on file   Highest education level: Not on file  Occupational History   Not on file  Tobacco Use   Smoking status: Never   Smokeless tobacco: Never  Vaping Use   Vaping status: Never Used  Substance and Sexual Activity   Alcohol use: Never   Drug use: Never   Sexual activity: Not on file  Other Topics Concern   Not on file  Social History Narrative   Not on file   Social Drivers of Health   Tobacco Use: Low Risk (08/30/2024)   Patient History    Smoking Tobacco Use: Never    Smokeless Tobacco Use: Never    Passive Exposure: Not on file  Financial Resource Strain: Not on file  Food Insecurity: Not on file  Transportation Needs: Not on file  Physical Activity: Not on file  Stress: Not on file  Social Connections: Not on file  Intimate Partner Violence: Not on file  Depression (PHQ2-9): Not on file  Alcohol Screen: Not on file  Housing: Not on file  Utilities: Not on file  Health Literacy: Not on file     RELEVANT GI HISTORY, IMAGING AND LABS: CBC    Component Value Date/Time   WBC 9.1 05/20/2024 0021   RBC 4.50 05/20/2024 0021   HGB 13.4 05/20/2024 0021   HCT 38.3 (L) 05/20/2024 0021   PLT 238 05/20/2024 0021   MCV 85.1 05/20/2024 0021   MCH 29.8 05/20/2024 0021   MCHC 35.0 05/20/2024 0021   RDW 12.2 05/20/2024 0021   LYMPHSABS 3.9 02/07/2024 2228   MONOABS 0.6 02/07/2024 2228   EOSABS 0.2 02/07/2024 2228   BASOSABS 0.1 02/07/2024 2228   Recent Labs    01/08/24 0533 02/07/24 2228 04/23/24 1458 05/20/24 0021  HGB 13.4 14.4 14.1 13.4    CMP     Component Value Date/Time   NA 136 05/20/2024 0021   K 3.4  (L) 05/20/2024 0021   CL 102 05/20/2024 0021   CO2 23 05/20/2024 0021   GLUCOSE 106 (H) 05/20/2024 0021   BUN 13 05/20/2024 0021   CREATININE 0.89 05/20/2024 0021   CALCIUM 8.9 05/20/2024 0021   PROT 7.7 04/23/2024 1458   ALBUMIN 4.4 04/23/2024 1458   AST 25 04/23/2024 1458   ALT 28 04/23/2024 1458   ALKPHOS 75 04/23/2024 1458   BILITOT 0.8 04/23/2024 1458   GFRNONAA >60 05/20/2024 0021   GFRAA >60 01/07/2020 2009      Latest Ref Rng & Units 04/23/2024    2:58 PM 02/07/2024   10:28 PM 05/14/2019    1:22 AM  Hepatic Function  Total Protein 6.5 - 8.1 g/dL 7.7  8.1  8.0   Albumin 3.5 - 5.0 g/dL 4.4  4.4  4.5   AST 15 - 41 U/L 25  24  25    ALT 0 - 44 U/L 28  25  23    Alk Phosphatase 38 - 126 U/L 75  51  53   Total Bilirubin 0.0 - 1.2 mg/dL 0.8  0.8  0.7       Review of Systems   All systems reviewed and negative except where noted in HPI.    Physical Exam  There were no vitals taken for this visit. No LMP for male patient. General:   Alert and oriented. Pleasant and cooperative. Well-nourished and well-developed. In no acute distress.  Head:  Normocephalic and atraumatic. Eyes:  Without icterus Ears:  Normal auditory acuity. Lungs:  Respirations even and unlabored.  Clear throughout to auscultation.   No wheezes, crackles, or rhonchi. No acute distress. Heart:  Regular rate and rhythm; no murmurs, clicks, rubs, or gallops. Abdomen:  Normal bowel sounds.  No bruits.  Diffuse mild tenderness throughout, soft, non-distended without masses, hepatosplenomegaly or hernias noted.  No guarding or rebound tenderness.    Rectal:  Deferred. Msk:  Symmetrical without gross deformities. Normal posture. Extremities:  Without edema. Neurologic:  Alert and  oriented x4;  grossly normal neurologically. Skin:  Intact without significant lesions or rashes. Psych:  Alert and cooperative. Normal mood and affect.   Assessment & Plan   Damon Stevens is a 39 y.o. male presenting  today with abdominal pain and intermittent diarrhea. Patient also expresses weight loss due to dietary restrictions from food allergies. Recent CBC and CMP normal. 04/2024 Ova/parasite lab negative, iron panel WNL.  - Ordered fecal calprotectin and celiac panel. - Advised continuation of current dietary restrictions based on known triggers. - Discussed antispasmodics for abdominal cramping and pain if indicated after diagnostic results. - Discussed subsequent procedures if above tests are abnormal. Also discussed IBS if no other pathology indicated.   Follow up in 1 month. Further recommendations pending labs.  Grayce Bohr, DNP, AGNP-C [facility]  "

## 2024-08-30 ENCOUNTER — Encounter: Payer: Self-pay | Admitting: Family Medicine

## 2024-08-30 ENCOUNTER — Ambulatory Visit: Admitting: Family Medicine

## 2024-08-30 VITALS — BP 122/75 | HR 80 | Temp 97.9°F | Ht 72.0 in | Wt 166.8 lb

## 2024-08-30 DIAGNOSIS — R197 Diarrhea, unspecified: Secondary | ICD-10-CM

## 2024-08-30 DIAGNOSIS — R1084 Generalized abdominal pain: Secondary | ICD-10-CM

## 2024-09-11 ENCOUNTER — Encounter: Admitting: Dietician

## 2024-10-01 ENCOUNTER — Ambulatory Visit: Admitting: Family Medicine

## 2025-02-12 ENCOUNTER — Ambulatory Visit: Admitting: Allergy & Immunology

## 2025-02-13 ENCOUNTER — Ambulatory Visit: Admitting: Allergy & Immunology
